# Patient Record
Sex: Male | Born: 1940
Health system: Southern US, Community
[De-identification: ages and names within clinical notes are randomized; demographics above are authoritative.]

## PROBLEM LIST (undated history)

## (undated) DIAGNOSIS — D126 Benign neoplasm of colon, unspecified: Secondary | ICD-10-CM

## (undated) DIAGNOSIS — E119 Type 2 diabetes mellitus without complications: Secondary | ICD-10-CM

## (undated) DIAGNOSIS — K635 Polyp of colon: Secondary | ICD-10-CM

## (undated) DIAGNOSIS — G4730 Sleep apnea, unspecified: Secondary | ICD-10-CM

## (undated) DIAGNOSIS — K219 Gastro-esophageal reflux disease without esophagitis: Secondary | ICD-10-CM

## (undated) DIAGNOSIS — I10 Essential (primary) hypertension: Secondary | ICD-10-CM

## (undated) DIAGNOSIS — E7800 Pure hypercholesterolemia, unspecified: Secondary | ICD-10-CM

## (undated) HISTORY — DX: Polyp of colon: K63.5

## (undated) HISTORY — PX: ROTATOR CUFF REPAIR: SHX139

## (undated) HISTORY — DX: Benign neoplasm of colon, unspecified: D12.6

---

## 1999-01-11 ENCOUNTER — Ambulatory Visit (HOSPITAL_COMMUNITY): Admission: RE | Admit: 1999-01-11 | Discharge: 1999-01-11 | Payer: Self-pay | Admitting: Neurosurgery

## 1999-01-19 ENCOUNTER — Observation Stay (HOSPITAL_COMMUNITY): Admission: RE | Admit: 1999-01-19 | Discharge: 1999-01-20 | Payer: Self-pay | Admitting: Neurosurgery

## 1999-08-30 ENCOUNTER — Encounter: Admission: RE | Admit: 1999-08-30 | Discharge: 1999-08-30 | Payer: Self-pay | Admitting: Neurosurgery

## 2000-08-20 ENCOUNTER — Ambulatory Visit (HOSPITAL_BASED_OUTPATIENT_CLINIC_OR_DEPARTMENT_OTHER): Admission: RE | Admit: 2000-08-20 | Discharge: 2000-08-20 | Payer: Self-pay | Admitting: Otolaryngology

## 2000-09-07 ENCOUNTER — Observation Stay (HOSPITAL_COMMUNITY): Admission: RE | Admit: 2000-09-07 | Discharge: 2000-09-08 | Payer: Self-pay | Admitting: Otolaryngology

## 2000-09-07 ENCOUNTER — Encounter (INDEPENDENT_AMBULATORY_CARE_PROVIDER_SITE_OTHER): Payer: Self-pay | Admitting: Specialist

## 2003-12-14 ENCOUNTER — Encounter: Admission: RE | Admit: 2003-12-14 | Discharge: 2003-12-14 | Payer: Self-pay | Admitting: Internal Medicine

## 2003-12-22 ENCOUNTER — Encounter: Admission: RE | Admit: 2003-12-22 | Discharge: 2003-12-22 | Payer: Self-pay | Admitting: Internal Medicine

## 2004-10-18 ENCOUNTER — Encounter: Admission: RE | Admit: 2004-10-18 | Discharge: 2004-10-18 | Payer: Self-pay | Admitting: Gastroenterology

## 2004-10-25 ENCOUNTER — Encounter: Admission: RE | Admit: 2004-10-25 | Discharge: 2004-10-25 | Payer: Self-pay | Admitting: Gastroenterology

## 2009-01-31 ENCOUNTER — Inpatient Hospital Stay (HOSPITAL_COMMUNITY): Admission: EM | Admit: 2009-01-31 | Discharge: 2009-02-02 | Payer: Self-pay | Admitting: Emergency Medicine

## 2009-02-01 ENCOUNTER — Encounter (INDEPENDENT_AMBULATORY_CARE_PROVIDER_SITE_OTHER): Payer: Self-pay | Admitting: Neurology

## 2009-02-02 ENCOUNTER — Encounter (INDEPENDENT_AMBULATORY_CARE_PROVIDER_SITE_OTHER): Payer: Self-pay | Admitting: Neurology

## 2009-05-21 ENCOUNTER — Ambulatory Visit: Payer: Self-pay | Admitting: Gastroenterology

## 2009-06-02 ENCOUNTER — Telehealth: Payer: Self-pay | Admitting: Gastroenterology

## 2009-06-03 ENCOUNTER — Ambulatory Visit: Payer: Self-pay | Admitting: Gastroenterology

## 2009-06-03 ENCOUNTER — Encounter: Payer: Self-pay | Admitting: Gastroenterology

## 2009-06-08 ENCOUNTER — Encounter: Payer: Self-pay | Admitting: Gastroenterology

## 2009-07-01 ENCOUNTER — Ambulatory Visit (HOSPITAL_BASED_OUTPATIENT_CLINIC_OR_DEPARTMENT_OTHER): Admission: RE | Admit: 2009-07-01 | Discharge: 2009-07-01 | Payer: Self-pay | Admitting: Orthopedic Surgery

## 2011-02-25 LAB — BASIC METABOLIC PANEL
BUN: 18 mg/dL (ref 6–23)
CO2: 27 mEq/L (ref 19–32)
Chloride: 105 mEq/L (ref 96–112)
Creatinine, Ser: 1.05 mg/dL (ref 0.4–1.5)
GFR calc Af Amer: >60 mL/min (ref >60)
Glucose, Bld: 187 mg/dL — ABNORMAL HIGH (ref 70–99)

## 2011-02-25 LAB — GLUCOSE, CAPILLARY: Glucose-Capillary: 103 mg/dL — ABNORMAL HIGH (ref 70–99)

## 2011-02-26 LAB — GLUCOSE, CAPILLARY: Glucose-Capillary: 133 mg/dL — ABNORMAL HIGH (ref 70–99)

## 2011-03-02 LAB — COMPREHENSIVE METABOLIC PANEL
ALT: 30 U/L (ref 0–53)
Albumin: 4 g/dL (ref 3.5–5.2)
Alkaline Phosphatase: 83 U/L (ref 39–117)
Glucose, Bld: 116 mg/dL — ABNORMAL HIGH (ref 70–99)
Potassium: 4.1 mEq/L (ref 3.5–5.1)
Sodium: 136 mEq/L (ref 135–145)
Total Protein: 6.7 g/dL (ref 6.0–8.3)

## 2011-03-02 LAB — LIPID PANEL
LDL Cholesterol: 69 mg/dL (ref 0–99)
Total CHOL/HDL Ratio: 4 RATIO
VLDL: 23 mg/dL (ref 0–40)

## 2011-03-02 LAB — GLUCOSE, CAPILLARY
Glucose-Capillary: 127 mg/dL — ABNORMAL HIGH (ref 70–99)
Glucose-Capillary: 128 mg/dL — ABNORMAL HIGH (ref 70–99)
Glucose-Capillary: 128 mg/dL — ABNORMAL HIGH (ref 70–99)

## 2011-03-02 LAB — CBC
Hemoglobin: 13.1 g/dL (ref 13.0–17.0)
RDW: 14.8 % (ref 11.5–15.5)
WBC: 7.5 10*3/uL (ref 4.0–10.5)

## 2011-03-02 LAB — DIFFERENTIAL
Basophils Relative: 1 % (ref 0–1)
Eosinophils Absolute: 0.1 10*3/uL (ref 0.0–0.7)
Monocytes Absolute: 0.5 10*3/uL (ref 0.1–1.0)
Monocytes Relative: 7 % (ref 3–12)
Neutrophils Relative %: 66 % (ref 43–77)

## 2011-03-02 LAB — HEMOGLOBIN A1C
Hgb A1c MFr Bld: 7.2 % — ABNORMAL HIGH (ref 4.6–6.1)
Mean Plasma Glucose: 160 mg/dL

## 2011-03-02 LAB — URINALYSIS, ROUTINE W REFLEX MICROSCOPIC
Bilirubin Urine: NEGATIVE
Glucose, UA: NEGATIVE mg/dL
Ketones, ur: NEGATIVE mg/dL
pH: 7.5 (ref 5.0–8.0)

## 2011-03-02 LAB — CK TOTAL AND CKMB (NOT AT ARMC)
CK, MB: 1.7 ng/mL (ref 0.3–4.0)
Total CK: 149 U/L (ref 7–232)

## 2011-04-04 NOTE — Op Note (Signed)
NAME:  Timothy [name], Timothy [name] NO.:  [id number]   MEDICAL RECORD NO.:  [id number]          PATIENT TYPE:  AMB   LOCATION:  DSC                          FACILITY:  [facility]   PHYSICIAN:  Loreta Ave, M.D. DATE OF BIRTH:  October 15, 1941   DATE OF PROCEDURE:  07/01/2009  DATE OF DISCHARGE:                               OPERATIVE REPORT   PREOPERATIVE DIAGNOSES:  Chronic impingement, extensive chronic rotator  cuff tear, partial tearing of biceps tendon, distal clavicle osteolysis,  all right shoulder.   POSTOPERATIVE DIAGNOSES:  Chronic impingement, extensive chronic rotator  cuff tear, partial tearing of biceps tendon, distal clavicle osteolysis,  all right shoulder, with essential complete tear, long head biceps  tendon chronic irreparable retracted rotator cuff tear supraspinatus  tendon with destruction of essentially the entire tendon and involvement  of the top of the subscap and infraspinatus.  Impingement with a type 3  acromion.  Grade 4 changes of the acromioclavicular joint.  Complex  tearing of the labrum.  Grade 3 chondral flap tear of glenoid.   PROCEDURE:  Right shoulder exam under anesthesia ,arthroscopy,  debridement of glenohumeral joint, chondral flap, labrum.  Debridement  and resection of intra-articular portion of long head biceps tendon.  Debridement and assessment of rotator cuff.  Bursectomy, acromioplasty,  coracoacromial ligament release.  Excision of distal clavicle.   SURGEON:  Loreta Ave, MD   ASSISTANT:  Genene Churn. Barry Dienes, [place]   ANESTHESIA:  General.   BLOOD LOSS:  Minimal.   SPECIMENS:  None.   CULTURES:  None.   COMPLICATIONS:  None.   DRESSING:  Soft compressive with sling.   PROCEDURE:  The patient brought to the operating room, placed on the  operating table in supine position.  After adequate anesthesia had been  obtained, shoulder examined.  Stiffness but basically full motion and  stable shoulder.  Placed in a beach-chair  position on the shoulder  positioner, prepped and draped in usual sterile fashion.  Three portals  anterior, posterior, and lateral.  Shoulder entered with blunt  obturator.  Arthroscope introduced.  Shoulder distended and inspected.  A deep grade 3 flap tear in the middle of glenoid debrided.  Complex  tearing of labrum debrided.  Most remaining articular cartilage looked  good.  Biceps tendon essentially completely torn throughout the entire  intra-articular portion.  Nothing reparable.  Resected back to the level  of the bicipital groove.  Chronic tearing of supraspinatus tendon which  was assessed above and below.  Essentially destruction of the entire  supraspinatus tendon from the humerus all the way over the  musculotendinous junction.  Scarred, irreparable, fibrotic.  Involvement  of the top of the infraspinatus and subscap.  Debrided back to the  margin of healthy tissue throughout.  From above, I did not feel this  was irreparable and really could not mobilize it out laterally or even  in front to back.  Bursa resected.  Acromioplasty to a type 1 acromion  with shaver and high-speed bur.  CA ligament released.  Distal clavicle  exposed.  Periarticular spurs lateral centimeter of clavicle resected.  At completion, adequacy of decompression and debridement confirmed  viewing from all portals.  I then thoroughly assessed the cuff and even  put FiberWire sutures in this to see if I could mobilize and pull it  with that, and there was really nothing that was reparable at all.  Went  back and just redebrided the margins, so irritated tissue out of the  way.  Instruments and fluid removed.  Portals closed with nylon.  A  sterile compressive dressing applied.  Sling applied.  Anesthesia  reversed.  Brought to recovery room.  Tolerated surgery well.  No  complications.      Loreta Ave, M.D.  Electronically Signed     DFM/MEDQ  D:  07/01/2009  T:  07/02/2009  Job:  [id number]

## 2011-04-04 NOTE — H&P (Signed)
NAME:  Timothy [name], Timothy [name] NO.:  [id number]   MEDICAL RECORD NO.:  [id number]          PATIENT TYPE:  EMS   LOCATION:  MAJO                         FACILITY:  [facility]   PHYSICIAN:  Noel Christmas, MD    DATE OF BIRTH:  1941-04-14   DATE OF ADMISSION:  01/31/2009  DATE OF DISCHARGE:                              HISTORY & PHYSICAL   CHIEF COMPLAINT:  Numbness and clumsiness involving his right hand and  new-onset gait difficulty.   HISTORY OF PRESENT ILLNESS:  This is a 70 year old man who experienced  acute onset of numbness and clumsiness involving his right hand as well  as numbness involving his face about 10'clock last night.  He noticed  clumsiness involving his right leg this morning, but no numbness.  Numbness on the right hand and face has persisted.  He has not  experienced frank weakness.  His wife indicated his speech has been  slightly slurred at times.  CT scan of his head showed no acute  intracranial abnormality.  There is no previous history of stroke or  TIA.  He has not been on antiplatelet therapy.  There has been no  improvement.  No fluctuation of symptoms in its onset.   PAST MEDICAL HISTORY:  Remarkable for hypertension, diabetes mellitus,  degenerative disease, and surgery involving cervical spine, obstructive  sleep apnea with subsequent UPPP procedure, hyperlipidemia, depression,  and GERD.   CURRENT MEDICATIONS:  1. Actos Plus 15/850 b.i.d.  2. Diltiazem.  3. HCl 120/24 daily.  4. Lisinopril 10 mg per day.  5. Prilosec 20 mg per day.  6. Zoloft 100 mg per day.  7. Zocor 40 mg per day.   ALLERGIES:  NKDA.   FAMILY HISTORY:  Positive for stroke involving his father and for  diabetes mellitus involving one sister.   SOCIAL HISTORY:  The patient is retired.  There is no history of tobacco  use.  He infrequently consumes alcohol.  There is no history of illicit  drug use.   REVIEW OF SYSTEMS:  Completely negative except for above  presenting  symptoms as described.   PHYSICAL EXAMINATION:  VITAL SIGNS:  Temperature was 97.8, pulse 88 per  minute, respiration 16 per minute, blood pressure 150/70, and oxygen  saturation was 98% on room air.  GENERAL:  Late middle-aged appearing man of medium built who was  slightly overweight.  He was alert and cooperative, and in no acute  distress.  He was well oriented to time as well as place.  Short-term  and long-term memory were normal.  Affect was appropriate.  HEAD, EYES, EARS, NOSE, AND THROAT:  Normal.  Palatal changes were  consistent with history of UPPP procedure.  NECK:  Supple with no masses or tenderness.  CHEST:  Clear to auscultation.  HEART:  Rate and rhythm were normal.  S1 and S2 were also normal.  He  had no abnormal heart sounds.  ABDOMEN:  Soft and nontender.  Bowel sounds were normal.  SKIN AND MUCOSA:  Normal.  EXTREMITIES:  Normal.  GENITALIA AND RECTUM:  Deferred.  NEUROLOGIC:  Pupils were equal  and reactive normally to light.  Extraocular movements were full and conjugate.  Visual fields were  intact and normal.  There was no facial weakness.  He had mild right  lower facial numbness.  Hearing was normal.  Speech and palatal movement  normal.  Coordination of upper and lower extremity on the right was  slightly impaired.  Coordination of left extremities was normal.  Strength and muscle tone were normal throughout including no pronator  drift of his right upper extremity.  Deep tendon reflexes were normal  and symmetrical.  Plantar responses were flexor.  Sensory exam showed  reduced perception of touch over his right hand compared to left.  There  is no sensory loss involving the right lower extremity.  Carotid  auscultation was normal.   CLINICAL IMPRESSION:  Acute probable subcortical parietal left ischemic  infarction.  This is most likely of small vessel origin given his  history of hypertension and diabetes mellitus and pattern of  presenting  deficits.   PLAN:  1. MRI and MRA of the brain without contrast.  2. Carotid Doppler study.  3. Echocardiogram.  4. Physical therapy and occupational therapy consults.  5. Aspirin 325 mg per day.      Noel Christmas, MD  Electronically Signed     CS/MEDQ  D:  01/31/2009  T:  02/01/2009  Job:  [id number]

## 2011-04-04 NOTE — Discharge Summary (Signed)
NAME:  Timothy [name], Timothy [name] NO.:  [id number]   MEDICAL RECORD NO.:  [id number]          PATIENT TYPE:  INP   LOCATION:  3038                         FACILITY:  [facility]   PHYSICIAN:  Pramod P. Pearlean Brownie, MD    DATE OF BIRTH:  September 06, 1941   DATE OF ADMISSION:  01/31/2009  DATE OF DISCHARGE:  02/02/2009                               DISCHARGE SUMMARY   DIAGNOSES AT TIME OF DISCHARGE:  1. Left thalamic infarct secondary to small-vessel disease.  2. Hypertension.  3. Diabetes.  4. Dyslipidemia.  5. Degenerative disease and surgery of cervical spine.  6. Obstructive sleep apnea with subsequent uvulopalatopharyngoplasty      procedure.  7. Depression.  8. Gastroesophageal reflux disease.   DISCHARGE MEDICATIONS:  1. Simvastatin 40 mg a day.  2. Actos 15 mg/metformin 850 mg b.i.d.  3. Tiazac 120 mg x2 tablets once a day.  4. Omeprazole 20 mg every day.  5. Lisinopril 10 mg every day.  6. Sertraline 100 mg every day.  7. Aspirin 325 mg a day.   STUDIES PERFORMED:  1. CT of the brain on admission showed no acute abnormality.  2. MRI of the brain shows acute left thalamic infarct.  3. MRA of the brain showed no cerebral aneurysm with mild      atherosclerotic irregularity of the posterior cerebral artery on      the left.  4. Carotid Doppler showed no ICA stenosis.  5. A 2-D echocardiogram shows EF of 55-60% with no obvious source of      embolus.  6. EKG shows normal sinus rhythm.   LABORATORY STUDIES:  Homocystine 9.1.  Cholesterol 123, triglycerides  116, HDL 31, and LDL 69.  Hemoglobin A1c 7.2.  Cardiac enzymes negative.  Coags normal.  Urinalysis normal.  Chemistry with glucose 116.  SGOT 59,  otherwise normal.   HISTORY OF PRESENT ILLNESS:  [name] is a 70 year old right-  handed white male who experienced acute onset of numbness and clumsiness  of his right hand and face at 10:00 p.m. the night prior to admission.  He noticed clumsiness involving his  right leg the morning of admission  with no numbness.  Numbness on the right hand and face has persisted.  His wife indicated his speech has been slightly slurred at time.  CT of  the brain showed no acute abnormality.  He has no previous history of  stroke or TIA.  He has not been on aspirin prior to admission.  He was  admitted to the hospital for further stroke evaluation.  He was not a  tPA candidate secondary to time of onset being greater than 3 hours and  quick resolution of symptoms.   HOSPITAL COURSE:  MRI did reveal an acute left thalamic infarct felt to  be secondary to small-vessel disease.  He was placed on aspirin 325 mg a  day for secondary stroke prevention.  He has multiple risk factors for  stroke including hypertension, diabetes, dyslipidemia, and obstructive  sleep apnea.  He will need ongoing risk factor control by his primary  care physician as an  outpatient.  He has no therapy needs and is safe  for discharge home.   CONDITION ON DISCHARGE:  The patient is alert and oriented x3.  Speech  clear.  He complains of right face and hand paresthesias that are very  mild.  He has no aphasia.  He has no drift in his arm or leg.  No  weakness except in his right hand with finger tap.   DISCHARGE PLAN:  1. Discharge home with family.  2. Aspirin for secondary stroke prevention.  3. Ongoing risk factor control by primary care physician.  4. Follow up with primary care physician within 1 month.  5. Follow up with Dr. Delia Heady in 2 months.      Annie Main, N.P.    ______________________________  Sunny Schlein. Pearlean Brownie, MD    SB/MEDQ  D:  02/02/2009  T:  02/03/2009  Job:  [id number]   cc:   Gaspar Garbe, M.D.

## 2011-04-07 NOTE — Op Note (Signed)
[facility]  Patient:    Timothy [name], Timothy [name]                    MRN: [id number] Proc. Date: 09/07/00 Adm. Date:  [id number] Attending:  Waldon Merl CC:         Lum Babe, M.D.   Operative Report  PREOPERATIVE DIAGNOSIS:  Sleep apnea with tonsillar hypertrophy with palatal redundance and a small oral cavity with a septal deviation and turbinate hypertrophy.  POSTOPERATIVE DIAGNOSIS:  Sleep apnea with tonsillar hypertrophy with palatal redundance and a small oral cavity with a septal deviation and turbinate hypertrophy.  OPERATION:  Septal reconstruction, turbinate reduction with palatal pharyngoplasty, and tonsillectomy.  SURGEON:  Keturah Barre, M.D.  ANESTHESIA:  General endotracheal with Mack Guise, M.D.  DESCRIPTION OF PROCEDURE:  The patient was placed in the supine position under general endotracheal anesthesia.  The patient was prepped and draped in a prone manner using Hibiclens x 3 and the usual head drape.  One percent Xylocaine with epinephrine and topical cocaine was used, 200 mg for the local supplemental anesthesia.  A hemitransfixion incision was made on the right side of the columella and carried around to the left, and back along the left quadrilateral cartilage, and the posterior aspect of the quadrilateral cartilage, a strip was taken using the Therapist, nutritional.  The open and closed Jansen-Middletons were then used to take an ethmoid septal deviation portion, and the mucoperiosteum was completely elevated back into the vomerine region, and this was also taken using the open and closed Jansen-Middletons, and the 2 mm chisel.  The remainder of the septum was trimmed and then replaced back on the premaxillary crest so that the septum was in the midline.  The turbinates were outfractured using the butter knife to crush them bilaterally to give more nasal airway space.  Once this was achieved, the closure was begun  using 5-0 plain catgut, and a 3-0 simple suture using 4-0 plain catgut through-and-through x 2, after using a mattress hemostatic stitch.  At this point, attention was then placed in the oral cavity, where the tonsillar gag was then placed.  Once the gag was extended, the tonsils were found to be taking a considerable amount of space.  The oral cavity was small. The uvula was large and redundant as was the palatal mucous membranes.  The tonsils were removed using sharp, blunt, and snare dissection, and hemostasis was established with Bovie electrocoagulation.  Once these were removed, a considerable amount of space was gained.  The uvula was trimmed, taking approximately two-thirds, and bringing it back to a more normal orientation.  The closure of the anterior and posterior mucous membrane was with 5-0 plain catgut.  The palatal mucous membrane was then trimmed, reestablishing the normal contour of the palate which was ranging at about 6 mm, and once this was achieved, all hemostasis was established, and the airway was markedly improved as far as size.  The anesthesia trumpets were then placed, using #7 on each side of the nose.  The patient was awakened.  He tolerated the procedure well and was doing well postoperatively.  His follow up will be in five days, then in two weeks, and four weeks, six weeks, and six months, and one year.  He will stay overnight for observation for his airway considering he has severe sleep apnea. DD:  09/07/00 TD:  09/08/00 Job: [id number]

## 2011-04-07 NOTE — H&P (Signed)
[facility]  Patient:    Timothy [name], Timothy [name]                      MRN: [id number] Attending:  Keturah Barre, M.D. CC:         Lum Babe, M.D.   History and Physical  HISTORY OF PRESENT ILLNESS:  This patient is a 70 year old male who has had severe sleep apnea problems.  He has had a sleep study done showing a sleep respiratory disturbance index of 23.  His lowest O2 saturation was 79.  He only spent 3.4% of his time in deep sleep, REM sleep.  He was awakened 47 times and was awake 9 times greater than 2 minutes.  His height is 5 feet 6 inches and he weighs 205 pounds.  He has a complaint of chronic fatigue where he is falling asleep at key times.  He cannot sit and read very well and with this decreased O2 saturation, he also has had some PVCs on his sleep study.  He was diagnosed with moderate obstructive sleep apnea.  All apneas occurred while patient was supine, but he does not seem to be able to overcome this by positioning and he has had very loud snoring.  We have talked about CPAP and he totally rejected that concept.  He is working on trying to decrease his weight.  He is on ______ 250 mg q.d. and Prilosec 20 mg q.d. for blood pressure elevation.  His blood pressure is mildly elevated at 170/100 and heart rate is 76.  SOCIAL HISTORY:  He quit smoking 30 years ago and does have an occasional alcoholic drink.  PAST HISTORY:  He has no problems with history of anesthesia.  He had a laminectomy several years ago at [facility] and he had a hiatal hernia and is on reflux medication at intermittent times.  He otherwise is in very good condition.  PHYSICAL EXAMINATION  VITAL SIGNS:  His blood pressure is 170/100, pulse is 76, SAO2 is 98%.  He weighs 208 pounds and he is stated to be 5 feet 8 inches in his physical examination.  HEENT:  His ears are clear.  Tympanic membranes look excellent.  Oral cavity is small.  He has tonsils that are  quite large and his palate is low and uvula is large.  His septum is deviated and has poor nasal airway.  CHEST:  Clear.  No rales, rhonchi or wheezes and his chest x-ray was clear.  CARDIAC:  His EKG was normal sinus rhythm.  No acute changes.  No PVCs.  No opening snaps, murmurs or gallops.  ABDOMEN:  Free of any organomegaly, tenderness or mass.  EXTREMITIES:  Initial extremities are unremarkable.  NEUROLOGIC:  Oriented x 3.  Cranial nerves intact.  INITIAL DIAGNOSIS:  Sleep apnea with history of mild obesity, history of hiatal hernia, septal deviation and history of cervical laminectomy at [facility].  ALLERGIES:  He has no allergies to medications. DD:  09/07/00 TD:  09/07/00 Job: [id number]

## 2014-03-11 ENCOUNTER — Encounter: Payer: Self-pay | Admitting: *Deleted

## 2014-03-13 ENCOUNTER — Encounter: Payer: Self-pay | Admitting: Gastroenterology

## 2014-05-21 ENCOUNTER — Encounter: Payer: Self-pay | Admitting: Gastroenterology

## 2015-03-30 ENCOUNTER — Encounter: Payer: Self-pay | Admitting: Gastroenterology

## 2015-10-27 DIAGNOSIS — I10 Essential (primary) hypertension: Secondary | ICD-10-CM | POA: Diagnosis not present

## 2015-10-27 DIAGNOSIS — K760 Fatty (change of) liver, not elsewhere classified: Secondary | ICD-10-CM | POA: Diagnosis not present

## 2015-10-27 DIAGNOSIS — R69 Illness, unspecified: Secondary | ICD-10-CM | POA: Diagnosis not present

## 2015-10-27 DIAGNOSIS — E1151 Type 2 diabetes mellitus with diabetic peripheral angiopathy without gangrene: Secondary | ICD-10-CM | POA: Diagnosis not present

## 2015-10-27 DIAGNOSIS — I493 Ventricular premature depolarization: Secondary | ICD-10-CM | POA: Diagnosis not present

## 2015-10-27 DIAGNOSIS — D692 Other nonthrombocytopenic purpura: Secondary | ICD-10-CM | POA: Diagnosis not present

## 2015-10-27 DIAGNOSIS — H3530 Unspecified macular degeneration: Secondary | ICD-10-CM | POA: Diagnosis not present

## 2015-10-27 DIAGNOSIS — I679 Cerebrovascular disease, unspecified: Secondary | ICD-10-CM | POA: Diagnosis not present

## 2015-10-27 DIAGNOSIS — E7800 Pure hypercholesterolemia, unspecified: Secondary | ICD-10-CM | POA: Diagnosis not present

## 2015-10-27 DIAGNOSIS — Z6835 Body mass index (BMI) 35.0-35.9, adult: Secondary | ICD-10-CM | POA: Diagnosis not present

## 2016-04-28 DIAGNOSIS — Z125 Encounter for screening for malignant neoplasm of prostate: Secondary | ICD-10-CM | POA: Diagnosis not present

## 2016-04-28 DIAGNOSIS — I10 Essential (primary) hypertension: Secondary | ICD-10-CM | POA: Diagnosis not present

## 2016-04-28 DIAGNOSIS — E1151 Type 2 diabetes mellitus with diabetic peripheral angiopathy without gangrene: Secondary | ICD-10-CM | POA: Diagnosis not present

## 2016-05-05 DIAGNOSIS — E7800 Pure hypercholesterolemia, unspecified: Secondary | ICD-10-CM | POA: Diagnosis not present

## 2016-05-05 DIAGNOSIS — I10 Essential (primary) hypertension: Secondary | ICD-10-CM | POA: Diagnosis not present

## 2016-05-05 DIAGNOSIS — M1990 Unspecified osteoarthritis, unspecified site: Secondary | ICD-10-CM | POA: Diagnosis not present

## 2016-05-05 DIAGNOSIS — R69 Illness, unspecified: Secondary | ICD-10-CM | POA: Diagnosis not present

## 2016-05-05 DIAGNOSIS — Z0000 Encounter for general adult medical examination without abnormal findings: Secondary | ICD-10-CM | POA: Diagnosis not present

## 2016-05-05 DIAGNOSIS — K760 Fatty (change of) liver, not elsewhere classified: Secondary | ICD-10-CM | POA: Diagnosis not present

## 2016-05-05 DIAGNOSIS — Z6833 Body mass index (BMI) 33.0-33.9, adult: Secondary | ICD-10-CM | POA: Diagnosis not present

## 2016-05-05 DIAGNOSIS — I679 Cerebrovascular disease, unspecified: Secondary | ICD-10-CM | POA: Diagnosis not present

## 2016-05-05 DIAGNOSIS — H3530 Unspecified macular degeneration: Secondary | ICD-10-CM | POA: Diagnosis not present

## 2016-05-05 DIAGNOSIS — E1151 Type 2 diabetes mellitus with diabetic peripheral angiopathy without gangrene: Secondary | ICD-10-CM | POA: Diagnosis not present

## 2016-08-10 DIAGNOSIS — R69 Illness, unspecified: Secondary | ICD-10-CM | POA: Diagnosis not present

## 2016-10-30 DIAGNOSIS — I679 Cerebrovascular disease, unspecified: Secondary | ICD-10-CM | POA: Diagnosis not present

## 2016-10-30 DIAGNOSIS — D692 Other nonthrombocytopenic purpura: Secondary | ICD-10-CM | POA: Diagnosis not present

## 2016-10-30 DIAGNOSIS — M1990 Unspecified osteoarthritis, unspecified site: Secondary | ICD-10-CM | POA: Diagnosis not present

## 2016-10-30 DIAGNOSIS — H3530 Unspecified macular degeneration: Secondary | ICD-10-CM | POA: Diagnosis not present

## 2016-10-30 DIAGNOSIS — Z6832 Body mass index (BMI) 32.0-32.9, adult: Secondary | ICD-10-CM | POA: Diagnosis not present

## 2016-10-30 DIAGNOSIS — R69 Illness, unspecified: Secondary | ICD-10-CM | POA: Diagnosis not present

## 2016-10-30 DIAGNOSIS — E7800 Pure hypercholesterolemia, unspecified: Secondary | ICD-10-CM | POA: Diagnosis not present

## 2016-10-30 DIAGNOSIS — I10 Essential (primary) hypertension: Secondary | ICD-10-CM | POA: Diagnosis not present

## 2016-10-30 DIAGNOSIS — K760 Fatty (change of) liver, not elsewhere classified: Secondary | ICD-10-CM | POA: Diagnosis not present

## 2016-10-30 DIAGNOSIS — E1151 Type 2 diabetes mellitus with diabetic peripheral angiopathy without gangrene: Secondary | ICD-10-CM | POA: Diagnosis not present

## 2017-05-02 DIAGNOSIS — Z125 Encounter for screening for malignant neoplasm of prostate: Secondary | ICD-10-CM | POA: Diagnosis not present

## 2017-05-02 DIAGNOSIS — E1151 Type 2 diabetes mellitus with diabetic peripheral angiopathy without gangrene: Secondary | ICD-10-CM | POA: Diagnosis not present

## 2017-05-02 DIAGNOSIS — E7800 Pure hypercholesterolemia, unspecified: Secondary | ICD-10-CM | POA: Diagnosis not present

## 2017-05-09 DIAGNOSIS — R69 Illness, unspecified: Secondary | ICD-10-CM | POA: Diagnosis not present

## 2017-05-09 DIAGNOSIS — Z23 Encounter for immunization: Secondary | ICD-10-CM | POA: Diagnosis not present

## 2017-05-09 DIAGNOSIS — Z0000 Encounter for general adult medical examination without abnormal findings: Secondary | ICD-10-CM | POA: Diagnosis not present

## 2017-05-09 DIAGNOSIS — H3530 Unspecified macular degeneration: Secondary | ICD-10-CM | POA: Diagnosis not present

## 2017-05-09 DIAGNOSIS — E1151 Type 2 diabetes mellitus with diabetic peripheral angiopathy without gangrene: Secondary | ICD-10-CM | POA: Diagnosis not present

## 2017-05-09 DIAGNOSIS — M1990 Unspecified osteoarthritis, unspecified site: Secondary | ICD-10-CM | POA: Diagnosis not present

## 2017-05-09 DIAGNOSIS — I10 Essential (primary) hypertension: Secondary | ICD-10-CM | POA: Diagnosis not present

## 2017-05-09 DIAGNOSIS — K760 Fatty (change of) liver, not elsewhere classified: Secondary | ICD-10-CM | POA: Diagnosis not present

## 2017-05-09 DIAGNOSIS — D692 Other nonthrombocytopenic purpura: Secondary | ICD-10-CM | POA: Diagnosis not present

## 2017-05-09 DIAGNOSIS — I6789 Other cerebrovascular disease: Secondary | ICD-10-CM | POA: Diagnosis not present

## 2017-05-09 DIAGNOSIS — E7800 Pure hypercholesterolemia, unspecified: Secondary | ICD-10-CM | POA: Diagnosis not present

## 2017-07-05 ENCOUNTER — Encounter: Payer: Self-pay | Admitting: Internal Medicine

## 2017-07-30 DIAGNOSIS — I10 Essential (primary) hypertension: Secondary | ICD-10-CM | POA: Diagnosis not present

## 2017-07-30 DIAGNOSIS — F419 Anxiety disorder, unspecified: Secondary | ICD-10-CM | POA: Insufficient documentation

## 2017-07-30 DIAGNOSIS — E119 Type 2 diabetes mellitus without complications: Secondary | ICD-10-CM | POA: Insufficient documentation

## 2017-07-30 DIAGNOSIS — R200 Anesthesia of skin: Secondary | ICD-10-CM | POA: Diagnosis not present

## 2017-07-30 DIAGNOSIS — E7800 Pure hypercholesterolemia, unspecified: Secondary | ICD-10-CM | POA: Insufficient documentation

## 2017-08-29 DIAGNOSIS — M1812 Unilateral primary osteoarthritis of first carpometacarpal joint, left hand: Secondary | ICD-10-CM | POA: Diagnosis not present

## 2017-08-29 DIAGNOSIS — R52 Pain, unspecified: Secondary | ICD-10-CM | POA: Insufficient documentation

## 2017-08-29 DIAGNOSIS — M542 Cervicalgia: Secondary | ICD-10-CM | POA: Insufficient documentation

## 2017-08-29 DIAGNOSIS — M79641 Pain in right hand: Secondary | ICD-10-CM | POA: Diagnosis not present

## 2017-08-29 DIAGNOSIS — R200 Anesthesia of skin: Secondary | ICD-10-CM | POA: Insufficient documentation

## 2017-08-29 DIAGNOSIS — M79642 Pain in left hand: Secondary | ICD-10-CM | POA: Diagnosis not present

## 2017-09-04 DIAGNOSIS — M79642 Pain in left hand: Secondary | ICD-10-CM | POA: Diagnosis not present

## 2017-09-04 DIAGNOSIS — M5412 Radiculopathy, cervical region: Secondary | ICD-10-CM | POA: Diagnosis not present

## 2017-09-04 DIAGNOSIS — Z981 Arthrodesis status: Secondary | ICD-10-CM | POA: Diagnosis not present

## 2017-09-04 DIAGNOSIS — G8929 Other chronic pain: Secondary | ICD-10-CM | POA: Diagnosis not present

## 2017-09-04 DIAGNOSIS — G5601 Carpal tunnel syndrome, right upper limb: Secondary | ICD-10-CM | POA: Diagnosis not present

## 2017-09-05 DIAGNOSIS — M1812 Unilateral primary osteoarthritis of first carpometacarpal joint, left hand: Secondary | ICD-10-CM | POA: Diagnosis not present

## 2017-09-05 DIAGNOSIS — M542 Cervicalgia: Secondary | ICD-10-CM | POA: Diagnosis not present

## 2017-09-05 DIAGNOSIS — R200 Anesthesia of skin: Secondary | ICD-10-CM | POA: Diagnosis not present

## 2017-09-06 ENCOUNTER — Other Ambulatory Visit: Payer: Self-pay | Admitting: Orthopedic Surgery

## 2017-09-06 ENCOUNTER — Encounter (HOSPITAL_BASED_OUTPATIENT_CLINIC_OR_DEPARTMENT_OTHER): Payer: Self-pay | Admitting: *Deleted

## 2017-09-07 ENCOUNTER — Encounter (HOSPITAL_BASED_OUTPATIENT_CLINIC_OR_DEPARTMENT_OTHER): Payer: Self-pay | Admitting: *Deleted

## 2017-09-10 ENCOUNTER — Encounter (HOSPITAL_BASED_OUTPATIENT_CLINIC_OR_DEPARTMENT_OTHER)
Admission: RE | Admit: 2017-09-10 | Discharge: 2017-09-10 | Disposition: A | Discharge status: Home / Self Care | Payer: Medicare HMO | Source: Ambulatory Visit | Attending: Orthopedic Surgery | Admitting: Orthopedic Surgery

## 2017-09-10 DIAGNOSIS — G5601 Carpal tunnel syndrome, right upper limb: Secondary | ICD-10-CM | POA: Diagnosis not present

## 2017-09-10 DIAGNOSIS — E7800 Pure hypercholesterolemia, unspecified: Secondary | ICD-10-CM | POA: Diagnosis not present

## 2017-09-10 DIAGNOSIS — I10 Essential (primary) hypertension: Secondary | ICD-10-CM | POA: Diagnosis not present

## 2017-09-10 DIAGNOSIS — K219 Gastro-esophageal reflux disease without esophagitis: Secondary | ICD-10-CM | POA: Diagnosis not present

## 2017-09-10 DIAGNOSIS — Z86010 Personal history of colonic polyps: Secondary | ICD-10-CM | POA: Diagnosis not present

## 2017-09-10 DIAGNOSIS — G4730 Sleep apnea, unspecified: Secondary | ICD-10-CM | POA: Diagnosis not present

## 2017-09-10 DIAGNOSIS — Z7984 Long term (current) use of oral hypoglycemic drugs: Secondary | ICD-10-CM | POA: Diagnosis not present

## 2017-09-10 DIAGNOSIS — E119 Type 2 diabetes mellitus without complications: Secondary | ICD-10-CM | POA: Diagnosis not present

## 2017-09-10 DIAGNOSIS — Z79899 Other long term (current) drug therapy: Secondary | ICD-10-CM | POA: Diagnosis not present

## 2017-09-10 LAB — BASIC METABOLIC PANEL
Anion gap: 8 (ref 5–15)
BUN: 22 mg/dL — ABNORMAL HIGH (ref 6–20)
CHLORIDE: 104 mmol/L (ref 101–111)
CO2: 25 mmol/L (ref 22–32)
Calcium: 8.6 mg/dL — ABNORMAL LOW (ref 8.9–10.3)
Creatinine, Ser: 1.12 mg/dL (ref 0.61–1.24)
GFR calc non Af Amer: >60 mL/min (ref >60)
Glucose, Bld: 127 mg/dL — ABNORMAL HIGH (ref 65–99)
POTASSIUM: 3.8 mmol/L (ref 3.5–5.1)
SODIUM: 137 mmol/L (ref 135–145)

## 2017-09-11 ENCOUNTER — Ambulatory Visit (HOSPITAL_BASED_OUTPATIENT_CLINIC_OR_DEPARTMENT_OTHER): Payer: Medicare HMO | Admitting: Anesthesiology

## 2017-09-11 ENCOUNTER — Encounter (HOSPITAL_BASED_OUTPATIENT_CLINIC_OR_DEPARTMENT_OTHER)
Admission: RE | Disposition: A | Discharge status: Home / Self Care | Payer: Self-pay | Source: Ambulatory Visit | Attending: Orthopedic Surgery

## 2017-09-11 ENCOUNTER — Ambulatory Visit (HOSPITAL_BASED_OUTPATIENT_CLINIC_OR_DEPARTMENT_OTHER)
Admission: RE | Admit: 2017-09-11 | Discharge: 2017-09-11 | Disposition: A | Discharge status: Home / Self Care | Payer: Medicare HMO | Source: Ambulatory Visit | Attending: Orthopedic Surgery | Admitting: Orthopedic Surgery

## 2017-09-11 ENCOUNTER — Encounter (HOSPITAL_BASED_OUTPATIENT_CLINIC_OR_DEPARTMENT_OTHER): Payer: Self-pay | Admitting: *Deleted

## 2017-09-11 DIAGNOSIS — G4730 Sleep apnea, unspecified: Secondary | ICD-10-CM | POA: Diagnosis not present

## 2017-09-11 DIAGNOSIS — E119 Type 2 diabetes mellitus without complications: Secondary | ICD-10-CM | POA: Insufficient documentation

## 2017-09-11 DIAGNOSIS — Z7984 Long term (current) use of oral hypoglycemic drugs: Secondary | ICD-10-CM | POA: Diagnosis not present

## 2017-09-11 DIAGNOSIS — I10 Essential (primary) hypertension: Secondary | ICD-10-CM | POA: Diagnosis not present

## 2017-09-11 DIAGNOSIS — G5601 Carpal tunnel syndrome, right upper limb: Secondary | ICD-10-CM | POA: Insufficient documentation

## 2017-09-11 DIAGNOSIS — Z79899 Other long term (current) drug therapy: Secondary | ICD-10-CM | POA: Insufficient documentation

## 2017-09-11 DIAGNOSIS — K219 Gastro-esophageal reflux disease without esophagitis: Secondary | ICD-10-CM | POA: Insufficient documentation

## 2017-09-11 DIAGNOSIS — E7800 Pure hypercholesterolemia, unspecified: Secondary | ICD-10-CM | POA: Diagnosis not present

## 2017-09-11 DIAGNOSIS — Z86010 Personal history of colonic polyps: Secondary | ICD-10-CM | POA: Diagnosis not present

## 2017-09-11 HISTORY — DX: Sleep apnea, unspecified: G47.30

## 2017-09-11 HISTORY — DX: Pure hypercholesterolemia, unspecified: E78.00

## 2017-09-11 HISTORY — DX: Essential (primary) hypertension: I10

## 2017-09-11 HISTORY — DX: Gastro-esophageal reflux disease without esophagitis: K21.9

## 2017-09-11 HISTORY — PX: CARPAL TUNNEL RELEASE: SHX101

## 2017-09-11 HISTORY — DX: Type 2 diabetes mellitus without complications: E11.9

## 2017-09-11 LAB — GLUCOSE, CAPILLARY
GLUCOSE-CAPILLARY: 113 mg/dL — AB (ref 65–99)
GLUCOSE-CAPILLARY: 121 mg/dL — AB (ref 65–99)

## 2017-09-11 SURGERY — CARPAL TUNNEL RELEASE
Anesthesia: Regional | Site: Wrist | Laterality: Right

## 2017-09-11 MED ORDER — ONDANSETRON HCL 4 MG/2ML IJ SOLN
INTRAMUSCULAR | Status: DC | PRN
  Administered 2017-09-11: 4 mg via INTRAVENOUS

## 2017-09-11 MED ORDER — PHENYLEPHRINE 40 MCG/ML (10ML) SYRINGE FOR IV PUSH (FOR BLOOD PRESSURE SUPPORT)
PREFILLED_SYRINGE | INTRAVENOUS | Status: AC
  Filled 2017-09-11: qty 10

## 2017-09-11 MED ORDER — FENTANYL CITRATE (PF) 100 MCG/2ML IJ SOLN: 25.0000 ug | INTRAMUSCULAR | Status: DC | PRN

## 2017-09-11 MED ORDER — SUCCINYLCHOLINE CHLORIDE 200 MG/10ML IV SOSY
PREFILLED_SYRINGE | INTRAVENOUS | Status: AC
  Filled 2017-09-11: qty 10

## 2017-09-11 MED ORDER — ONDANSETRON HCL 4 MG/2ML IJ SOLN
INTRAMUSCULAR | Status: AC
  Filled 2017-09-11: qty 2

## 2017-09-11 MED ORDER — BUPIVACAINE HCL (PF) 0.25 % IJ SOLN
INTRAMUSCULAR | Status: DC | PRN
  Administered 2017-09-11: 6 mL

## 2017-09-11 MED ORDER — LACTATED RINGERS IV SOLN
INTRAVENOUS | Status: DC
  Administered 2017-09-11: 08:00:00 via INTRAVENOUS

## 2017-09-11 MED ORDER — LIDOCAINE 2% (20 MG/ML) 5 ML SYRINGE
INTRAMUSCULAR | Status: AC
  Filled 2017-09-11: qty 5

## 2017-09-11 MED ORDER — FENTANYL CITRATE (PF) 100 MCG/2ML IJ SOLN
INTRAMUSCULAR | Status: AC
  Filled 2017-09-11: qty 2

## 2017-09-11 MED ORDER — CHLORHEXIDINE GLUCONATE 4 % EX LIQD: 60.0000 mL | Freq: Once | CUTANEOUS | Status: DC

## 2017-09-11 MED ORDER — LIDOCAINE HCL (PF) 0.5 % IJ SOLN
INTRAMUSCULAR | Status: DC | PRN
  Administered 2017-09-11: 30 mL via INTRAVENOUS

## 2017-09-11 MED ORDER — FENTANYL CITRATE (PF) 100 MCG/2ML IJ SOLN
50.0000 ug | INTRAMUSCULAR | Status: DC | PRN
  Administered 2017-09-11: 50 ug via INTRAVENOUS

## 2017-09-11 MED ORDER — MIDAZOLAM HCL 2 MG/2ML IJ SOLN
INTRAMUSCULAR | Status: AC
  Filled 2017-09-11: qty 2

## 2017-09-11 MED ORDER — MIDAZOLAM HCL 2 MG/2ML IJ SOLN
1.0000 mg | INTRAMUSCULAR | Status: DC | PRN
  Administered 2017-09-11: 1 mg via INTRAVENOUS

## 2017-09-11 MED ORDER — BUPIVACAINE HCL (PF) 0.25 % IJ SOLN
INTRAMUSCULAR | Status: AC
  Filled 2017-09-11: qty 90

## 2017-09-11 MED ORDER — CEFAZOLIN SODIUM-DEXTROSE 2-4 GM/100ML-% IV SOLN
2.0000 g | INTRAVENOUS | Status: AC
  Administered 2017-09-11: 2 g via INTRAVENOUS

## 2017-09-11 MED ORDER — CEFAZOLIN SODIUM-DEXTROSE 2-4 GM/100ML-% IV SOLN
INTRAVENOUS | Status: AC
  Filled 2017-09-11: qty 100

## 2017-09-11 MED ORDER — BUPIVACAINE HCL (PF) 0.5 % IJ SOLN
INTRAMUSCULAR | Status: AC
  Filled 2017-09-11: qty 30

## 2017-09-11 MED ORDER — BUPIVACAINE-EPINEPHRINE (PF) 0.5% -1:200000 IJ SOLN
INTRAMUSCULAR | Status: AC
  Filled 2017-09-11: qty 30

## 2017-09-11 MED ORDER — MEPERIDINE HCL 25 MG/ML IJ SOLN: 6.2500 mg | INTRAMUSCULAR | Status: DC | PRN

## 2017-09-11 MED ORDER — EPHEDRINE 5 MG/ML INJ
INTRAVENOUS | Status: AC
  Filled 2017-09-11: qty 10

## 2017-09-11 MED ORDER — HYDROCODONE-ACETAMINOPHEN 5-325 MG PO TABS: 1.0000 | ORAL_TABLET | Freq: Four times a day (QID) | ORAL | 0 refills | Status: DC | PRN

## 2017-09-11 MED ORDER — METOCLOPRAMIDE HCL 5 MG/ML IJ SOLN: 10.0000 mg | Freq: Once | INTRAMUSCULAR | Status: DC | PRN

## 2017-09-11 MED ORDER — PROPOFOL 10 MG/ML IV BOLUS
INTRAVENOUS | Status: DC | PRN
  Administered 2017-09-11: 20 mg via INTRAVENOUS

## 2017-09-11 MED ORDER — SCOPOLAMINE 1 MG/3DAYS TD PT72: 1.0000 | MEDICATED_PATCH | Freq: Once | TRANSDERMAL | Status: DC | PRN

## 2017-09-11 SURGICAL SUPPLY — 34 items
BLADE SURG 15 STRL LF DISP TIS (BLADE) ×1 IMPLANT
BLADE SURG 15 STRL SS (BLADE) ×1
BNDG COHESIVE 3X5 TAN STRL LF (GAUZE/BANDAGES/DRESSINGS) ×2 IMPLANT
BNDG ESMARK 4X9 LF (GAUZE/BANDAGES/DRESSINGS) ×2 IMPLANT
BNDG GAUZE ELAST 4 BULKY (GAUZE/BANDAGES/DRESSINGS) ×2 IMPLANT
CHLORAPREP W/TINT 26ML (MISCELLANEOUS) ×2 IMPLANT
CORD BIPOLAR FORCEPS 12FT (ELECTRODE) ×2 IMPLANT
COVER BACK TABLE 60X90IN (DRAPES) ×2 IMPLANT
COVER MAYO STAND STRL (DRAPES) ×2 IMPLANT
CUFF TOURNIQUET SINGLE 18IN (TOURNIQUET CUFF) ×2 IMPLANT
DRAPE EXTREMITY T 121X128X90 (DRAPE) ×2 IMPLANT
DRAPE SURG 17X23 STRL (DRAPES) ×2 IMPLANT
DRSG PAD ABDOMINAL 8X10 ST (GAUZE/BANDAGES/DRESSINGS) ×2 IMPLANT
GAUZE SPONGE 4X4 12PLY STRL (GAUZE/BANDAGES/DRESSINGS) ×2 IMPLANT
GAUZE XEROFORM 1X8 LF (GAUZE/BANDAGES/DRESSINGS) ×2 IMPLANT
GLOVE BIOGEL PI IND STRL 7.0 (GLOVE) ×2 IMPLANT
GLOVE BIOGEL PI IND STRL 8.5 (GLOVE) ×1 IMPLANT
GLOVE BIOGEL PI INDICATOR 7.0 (GLOVE) ×2
GLOVE BIOGEL PI INDICATOR 8.5 (GLOVE) ×1
GLOVE ECLIPSE 6.5 STRL STRAW (GLOVE) ×2 IMPLANT
GLOVE SURG ORTHO 8.0 STRL STRW (GLOVE) ×4 IMPLANT
GOWN STRL REUS W/ TWL LRG LVL3 (GOWN DISPOSABLE) ×1 IMPLANT
GOWN STRL REUS W/TWL LRG LVL3 (GOWN DISPOSABLE) ×1
GOWN STRL REUS W/TWL XL LVL3 (GOWN DISPOSABLE) ×2 IMPLANT
NEEDLE PRECISIONGLIDE 27X1.5 (NEEDLE) ×2 IMPLANT
NS IRRIG 1000ML POUR BTL (IV SOLUTION) ×2 IMPLANT
PACK BASIN DAY SURGERY FS (CUSTOM PROCEDURE TRAY) ×2 IMPLANT
STOCKINETTE 4X48 STRL (DRAPES) ×2 IMPLANT
SUT ETHILON 4 0 PS 2 18 (SUTURE) ×2 IMPLANT
SUT VICRYL 4-0 PS2 18IN ABS (SUTURE) IMPLANT
SYR BULB 3OZ (MISCELLANEOUS) ×2 IMPLANT
SYR CONTROL 10ML LL (SYRINGE) ×2 IMPLANT
TOWEL OR 17X24 6PK STRL BLUE (TOWEL DISPOSABLE) ×2 IMPLANT
UNDERPAD 30X30 (UNDERPADS AND DIAPERS) IMPLANT

## 2017-09-11 NOTE — Anesthesia Preprocedure Evaluation (Signed)
Anesthesia Evaluation  Patient identified by MRN, date of birth, ID band Patient awake    Reviewed: Allergy & Precautions, NPO status , Patient's Chart, lab work & pertinent test results  Airway Mallampati: II  TM Distance: >3 FB Neck ROM: Full    Dental no notable dental hx.    Pulmonary sleep apnea ,    Pulmonary exam normal breath sounds clear to auscultation       Cardiovascular hypertension, Pt. on medications Normal cardiovascular exam Rhythm:Regular Rate:Normal     Neuro/Psych negative neurological ROS  negative psych ROS   GI/Hepatic Neg liver ROS, GERD  Controlled,  Endo/Other  diabetes, Type 2  Renal/GU negative Renal ROS  negative genitourinary   Musculoskeletal negative musculoskeletal ROS (+)   Abdominal   Peds negative pediatric ROS (+)  Hematology negative hematology ROS (+)   Anesthesia Other Findings   Reproductive/Obstetrics negative OB ROS                             Anesthesia Physical Anesthesia Plan  ASA: II  Anesthesia Plan: Bier Block   Post-op Pain Management:    Induction:   PONV Risk Score and Plan: 2 and Ondansetron and Treatment may vary due to age or medical condition  Airway Management Planned: Simple Face Mask  Additional Equipment:   Intra-op Plan:   Post-operative Plan:   Informed Consent: I have reviewed the patients History and Physical, chart, labs and discussed the procedure including the risks, benefits and alternatives for the proposed anesthesia with the patient or authorized representative who has indicated his/her understanding and acceptance.   Dental advisory given  Plan Discussed with: CRNA  Anesthesia Plan Comments:         Anesthesia Quick Evaluation

## 2017-09-11 NOTE — Brief Op Note (Signed)
09/11/2017  9:13 AM  PATIENT:  Timothy [name]  76 y.o. male  PRE-OPERATIVE DIAGNOSIS:  right carpal tunnel syndrome  POST-OPERATIVE DIAGNOSIS:  right carpal tunnel syndrome  PROCEDURE:  Procedure(s): RIGHT CARPAL TUNNEL RELEASE (Right)  SURGEON:  Surgeon(s) and Role:    Daryll Brod, MD - Primary  PHYSICIAN ASSISTANT:   ASSISTANTS: none   ANESTHESIA:   local, regional and IV sedation  EBL:  1 mL   BLOOD ADMINISTERED:none  DRAINS: none   LOCAL MEDICATIONS USED:  BUPIVICAINE   SPECIMEN:  No Specimen  DISPOSITION OF SPECIMEN:  N/A  COUNTS:  YES  TOURNIQUET:  * Missing tourniquet times found for documented tourniquets in log:  [id number] *  DICTATION: .Other Dictation: Dictation Number [id number]  PLAN OF CARE: Discharge to home after PACU  PATIENT DISPOSITION:  PACU - hemodynamically stable.

## 2017-09-11 NOTE — Anesthesia Postprocedure Evaluation (Signed)
Anesthesia Post Note  Patient: Timothy [name]  Procedure(s) Performed: RIGHT CARPAL TUNNEL RELEASE (Right Wrist)     Patient location during evaluation: PACU Anesthesia Type: Bier Block Level of consciousness: awake and alert Pain management: pain level controlled Vital Signs Assessment: post-procedure vital signs reviewed and stable Respiratory status: spontaneous breathing, nonlabored ventilation, respiratory function stable and patient connected to nasal cannula oxygen Cardiovascular status: stable and blood pressure returned to baseline Postop Assessment: no apparent nausea or vomiting Anesthetic complications: no    Last Vitals:  Vitals:   09/11/17 0930 09/11/17 1015  BP: (!) 193/84 (!) 190/81  Pulse: (!) 59 (!) 56  Resp: 10 18  Temp:  36.7 C  SpO2: 100% 99%    Last Pain:  Vitals:   09/11/17 1015  TempSrc:   PainSc: 0-No pain                 Montez Hageman

## 2017-09-11 NOTE — Transfer of Care (Signed)
Immediate Anesthesia Transfer of Care Note  Patient: Timothy [name]  Procedure(s) Performed: RIGHT CARPAL TUNNEL RELEASE (Right Wrist)  Patient Location: PACU  Anesthesia Type:MAC and Bier block  Level of Consciousness: awake, alert  and oriented  Airway & Oxygen Therapy: Patient Spontanous Breathing and Patient connected to face mask oxygen  Post-op Assessment: Report given to RN and Post -op Vital signs reviewed and stable  Post vital signs: Reviewed and stable  Last Vitals:  Vitals:   09/11/17 0750 09/11/17 0753  BP: (!) 199/77 (!) 190/84  Pulse: 66   Resp: 18   Temp: 36.6 C   SpO2: 100%     Last Pain:  Vitals:   09/11/17 0750  TempSrc: Oral  PainSc: 5       Patients Stated Pain Goal: 2 (71/69/67 8938)  Complications: No apparent anesthesia complications

## 2017-09-11 NOTE — Discharge Instructions (Addendum)

## 2017-09-11 NOTE — H&P (Signed)
Timothy [name] is an 76 y.o. male.   Chief Complaint:numbness right hand HPI: Timothy [name] is a 76 year old right-hand-dominant male who comes in with a complaint of pain stiffness of both hands right greater than left numbness and tingling of all of his digits. He states his right thumb is numb on a constant basis. He is waking 7 out of 7 nights. He states his been going a month to 6 weeks. Notes no history of injury to the hand or to the neck but has undergone a fusion of his cervical spine by Dr. Theda Sers many years ago. He states nothing seems to make it better or worse. Is not tried taking anything for this. Right is much worse than left. He has a history of diabetes no history of thyroid problems arthritis or gout. Family history is negative for each of these also.He was sent to Dr. Thereasa Parkin for nerve conductions which have been done. These reveal carpal tunnel syndrome right hand.          Past Medical History:  Diagnosis Date  . Diabetes mellitus without complication ([place])   . Elevated cholesterol   . GERD (gastroesophageal reflux disease)   . Hyperplastic colonic polyp    2010  . Hypertension   . Sleep apnea    no cpap  . Tubular adenoma of colon    2010    Past Surgical History:  Procedure Laterality Date  . ROTATOR CUFF REPAIR      History reviewed. No pertinent family history. Social History:  reports that he has never smoked. He has never used smokeless tobacco. He reports that he does not drink alcohol or use drugs.  Allergies: No Known Allergies  Medications Prior to Admission  Medication Sig Dispense Refill  . diltiazem (TIAZAC) 240 MG 24 hr capsule Take 240 mg by mouth daily.    . hydrochlorothiazide (HYDRODIURIL) 25 MG tablet Take 25 mg by mouth daily.    Marland Kitchen lisinopril (PRINIVIL,ZESTRIL) 40 MG tablet Take 40 mg by mouth daily.    . metFORMIN (GLUCOPHAGE) 850 MG tablet Take 850 mg by mouth 2 (two) times daily with a meal.    . omeprazole (PRILOSEC) 20 MG capsule  Take 20 mg by mouth daily.    . pravastatin (PRAVACHOL) 20 MG tablet Take 20 mg by mouth daily.    . sertraline (ZOLOFT) 100 MG tablet Take 100 mg by mouth daily.      Results for orders placed or performed during the hospital encounter of 09/11/17 (from the past 48 hour(s))  Basic metabolic panel     Status: Abnormal   Collection Time: 09/10/17  2:59 PM  Result Value Ref Range   Sodium 137 135 - 145 mmol/L   Potassium 3.8 3.5 - 5.1 mmol/L   Chloride 104 101 - 111 mmol/L   CO2 25 22 - 32 mmol/L   Glucose, Bld 127 (H) 65 - 99 mg/dL   BUN 22 (H) 6 - 20 mg/dL   Creatinine, Ser 1.12 0.61 - 1.24 mg/dL   Calcium 8.6 (L) 8.9 - 10.3 mg/dL   GFR calc non Af Amer >60 >60 mL/min   GFR calc Af Amer >60 >60 mL/min    Comment: (NOTE) The eGFR has been calculated using the CKD EPI equation. This calculation has not been validated in all clinical situations. eGFR's persistently <60 mL/min signify possible Chronic Kidney Disease.    Anion gap 8 5 - 15  Glucose, capillary     Status: Abnormal   Collection  Time: 09/11/17  7:57 AM  Result Value Ref Range   Glucose-Capillary 113 (H) 65 - 99 mg/dL    No results found.   Pertinent items are noted in HPI.  Blood pressure (!) 190/84, pulse 66, temperature 97.9 F (36.6 C), temperature source Oral, resp. rate 18, height _0  (1.676 m), weight 94.3 kg (208 lb), SpO2 100 %.  General appearance: alert, cooperative and appears stated age Head: Normocephalic, without obvious abnormality Neck: no JVD Resp: clear to auscultation bilaterally Cardio: regular rate and rhythm, S1, S2 normal, no murmur, click, rub or gallop GI: soft, non-tender; bowel sounds normal; no masses,  no organomegaly Extremities: numbness right hand Pulses: 2+ and symmetric Skin: Skin color, texture, turgor normal. No rashes or lesions Neurologic: Grossly normal Incision/Wound: na  Assessment/Plan Diagnosis carpal tunnel syndrome right hand. Plan: We have discussed  possibility surgical decompression of the median nerve right side. Pre-peri-and postoperative course are discussed along with risks and complications. He is where there is no guarantee to the surgery the possibility of infection recurrence injury to arteries nerves tendons incomplete relief symptoms dystrophy. Questions are encouraged and answered to his satisfaction. He would like to proceed with right carpal tunnel release this will be performed as an outpatient under regional anesthesia.         Tolulope Pinkett R 09/11/2017, 8:28 AM

## 2017-09-11 NOTE — Op Note (Signed)
Dictation Number [id number]

## 2017-09-11 NOTE — Op Note (Signed)
NAME:  Timothy [name], Timothy [name] NO.:  [id number]  MEDICAL RECORD NO.:  [id number]  LOCATION:                                 FACILITY:  PHYSICIAN:  Daryll Brod, M.D.            DATE OF BIRTH:  DATE OF PROCEDURE:  09/11/2017 DATE OF DISCHARGE:                              OPERATIVE REPORT   PREOPERATIVE DIAGNOSIS:  Carpal tunnel syndrome, right hand.  POSTOPERATIVE DIAGNOSIS:  Carpal tunnel syndrome, right hand.  OPERATION:  Decompression, right median nerve.  SURGEON:  Daryll Brod, MD.  ANESTHESIA:  Forearm-based IV regional with local infiltration.  PLACE OF SURGERY:  [facility].  ANESTHESIOLOGIST:  [place].  HISTORY:  The patient is a 76 year old male with a history of carpal tunnel syndrome, EMG nerve conductions positive.  He has elected to undergo surgical decompression to the median nerve on the right side. Pre, peri, and postoperative courses have been discussed along with risks and complications.  He is aware that there is no guarantee to the surgery, the possibility of infection, recurrence of injury to arteries, nerves, and tendons, incomplete relief of symptoms, and dystrophy.  In the preoperative area, the patient is seen, the extremity marked by both patient and surgeon, antibiotic given.  DESCRIPTION OF PROCEDURE:  The patient was brought to the operating room where a forearm-based IV regional anesthetic was carried out without difficulty under the direction of the Anesthesia Department.  He was prepped using ChloraPrep in a supine position with the right arm free. A 3-minute dry time was allowed and time-out was taken confirming the patient and procedure.  A longitudinal incision was then made in the right palm, carried down through subcutaneous tissue.  Bleeders were electrocauterized with bipolar.  Palmar fascia was split.  The superficial palmar arch was identified.  The flexor tendon to the ring and little finger was identified.  To  the ulnar side of the median nerve, the carpal retinaculum was then incised with sharp dissection.  A right-angle and Sewell retractor were placed between skin and forearm fascia.  The fascia was then released after dissecting the dorsal tissues away from the upper forearm fascia.  This was done for approximately 2 cm proximal to the wrist crease under direct vision. The canal was explored.  An area of compression to the nerve was apparent.  He had pedunculated area on the nerve.  This was left intact. The motor branch entered into muscle distally.  No further lesions were identified.  The wound was copiously irrigated with saline.  The skin was then closed with interrupted 4-0 nylon sutures.  Local infiltration with 0.25% bupivacaine without epinephrine was given, approximately 8 mL was used.  A sterile compressive dressing with fingers free was applied. On deflation of the tourniquet, all fingers immediately pinked.  He was taken to the recovery room for observation in satisfactory condition. He will be discharged home to return the [place] in 1 week, on Norco.          ______________________________ Daryll Brod, M.D.     GK/MEDQ  D:  09/11/2017  T:  09/11/2017  Job:  [id number]

## 2017-09-12 ENCOUNTER — Encounter (HOSPITAL_BASED_OUTPATIENT_CLINIC_OR_DEPARTMENT_OTHER): Payer: Self-pay | Admitting: Orthopedic Surgery

## 2017-10-17 ENCOUNTER — Other Ambulatory Visit: Payer: Self-pay | Admitting: Podiatry

## 2017-10-17 ENCOUNTER — Encounter: Payer: Self-pay | Admitting: Podiatry

## 2017-10-17 ENCOUNTER — Ambulatory Visit (INDEPENDENT_AMBULATORY_CARE_PROVIDER_SITE_OTHER): Payer: Medicare HMO

## 2017-10-17 ENCOUNTER — Ambulatory Visit: Payer: Medicare HMO | Admitting: Podiatry

## 2017-10-17 VITALS — BP 171/86 | HR 72 | Ht 66.0 in | Wt 200.0 lb

## 2017-10-17 DIAGNOSIS — M79671 Pain in right foot: Secondary | ICD-10-CM

## 2017-10-17 DIAGNOSIS — M779 Enthesopathy, unspecified: Principal | ICD-10-CM

## 2017-10-17 DIAGNOSIS — M7751 Other enthesopathy of right foot: Secondary | ICD-10-CM | POA: Diagnosis not present

## 2017-10-17 DIAGNOSIS — M778 Other enthesopathies, not elsewhere classified: Secondary | ICD-10-CM

## 2017-10-17 DIAGNOSIS — M19071 Primary osteoarthritis, right ankle and foot: Secondary | ICD-10-CM | POA: Diagnosis not present

## 2017-10-17 NOTE — Progress Notes (Signed)
   Subjective:    Patient ID: Timothy [name], male    DOB: 28-Mar-1941, 76 y.o.   MRN: [id number]  HPI  Chief Complaint  Patient presents with  . Diabetes    well controlled  . Foot Pain    right foot pain, top of foot - sometimes sharp, shooting up through shin       Review of Systems  All other systems reviewed and are negative.      Objective:   Physical Exam        Assessment & Plan:

## 2017-10-21 NOTE — Progress Notes (Signed)
   HPI: 76 year old male presenting today as a new patient with a complaint of intermittent right foot pain that has been ongoing for a few years. He reports the pain start on the dorsum of the foot and radiates up the anterior RLE. He reports associated intermittent swelling. Turning the foot in certain directions causes sharp pain. He denies alleviating factors. He has not done anything to treat the symptoms.    Past Medical History:  Diagnosis Date  . Diabetes mellitus without complication ([place])   . Elevated cholesterol   . GERD (gastroesophageal reflux disease)   . Hyperplastic colonic polyp    2010  . Hypertension   . Sleep apnea    no cpap  . Tubular adenoma of colon    2010     Physical Exam: General: The patient is alert and oriented x3 in no acute distress.  Dermatology: Skin is warm, dry and supple bilateral lower extremities. Negative for open lesions or macerations.  Vascular: Palpable pedal pulses bilaterally. No edema or erythema noted. Capillary refill within normal limits.  Neurological: Epicritic and protective threshold grossly intact bilaterally.   Musculoskeletal Exam: Pain with palpation to the right midfoot. Range of motion within normal limits to all pedal and ankle joints bilateral. Muscle strength 5/5 in all groups bilateral.   Radiographic Exam:  Diffuse degenerative changes noted throughout the midtarsal joints of the right foot.    Assessment: - DJD/Capsulitis right midfoot   Plan of Care:  - Patient evaluated. X-Rays reviewed.  - Injection of 0.5 mLs Celestone Soluspan injected into the right midfoot. - Recommended OTC Aleve as needed. - Recommended good shoe gear. - Return to clinic when necessary.    Edrick Kins, DPM [facility]  Dr. Edrick Kins, DPM    [address], [place] [address]                Office [phone]  Fax [phone]

## 2017-11-07 DIAGNOSIS — K760 Fatty (change of) liver, not elsewhere classified: Secondary | ICD-10-CM | POA: Diagnosis not present

## 2017-11-07 DIAGNOSIS — Z6833 Body mass index (BMI) 33.0-33.9, adult: Secondary | ICD-10-CM | POA: Diagnosis not present

## 2017-11-07 DIAGNOSIS — R69 Illness, unspecified: Secondary | ICD-10-CM | POA: Diagnosis not present

## 2017-11-07 DIAGNOSIS — E7800 Pure hypercholesterolemia, unspecified: Secondary | ICD-10-CM | POA: Diagnosis not present

## 2017-11-07 DIAGNOSIS — I10 Essential (primary) hypertension: Secondary | ICD-10-CM | POA: Diagnosis not present

## 2017-11-07 DIAGNOSIS — I6789 Other cerebrovascular disease: Secondary | ICD-10-CM | POA: Diagnosis not present

## 2017-11-07 DIAGNOSIS — E1151 Type 2 diabetes mellitus with diabetic peripheral angiopathy without gangrene: Secondary | ICD-10-CM | POA: Diagnosis not present

## 2017-11-07 DIAGNOSIS — D692 Other nonthrombocytopenic purpura: Secondary | ICD-10-CM | POA: Diagnosis not present

## 2017-11-07 DIAGNOSIS — Z23 Encounter for immunization: Secondary | ICD-10-CM | POA: Diagnosis not present

## 2017-11-07 DIAGNOSIS — M1990 Unspecified osteoarthritis, unspecified site: Secondary | ICD-10-CM | POA: Diagnosis not present

## 2018-01-15 ENCOUNTER — Other Ambulatory Visit: Payer: Self-pay

## 2018-01-15 ENCOUNTER — Emergency Department (HOSPITAL_COMMUNITY): Payer: Medicare HMO

## 2018-01-15 ENCOUNTER — Encounter: Payer: Self-pay | Admitting: Physician Assistant

## 2018-01-15 ENCOUNTER — Encounter (HOSPITAL_COMMUNITY): Payer: Self-pay

## 2018-01-15 ENCOUNTER — Emergency Department (HOSPITAL_COMMUNITY)
Admission: EM | Admit: 2018-01-15 | Discharge: 2018-01-15 | Disposition: A | Discharge status: Home / Self Care | Payer: Medicare HMO | Attending: Emergency Medicine | Admitting: Emergency Medicine

## 2018-01-15 DIAGNOSIS — R079 Chest pain, unspecified: Secondary | ICD-10-CM | POA: Diagnosis not present

## 2018-01-15 DIAGNOSIS — R1013 Epigastric pain: Secondary | ICD-10-CM | POA: Diagnosis not present

## 2018-01-15 DIAGNOSIS — K219 Gastro-esophageal reflux disease without esophagitis: Secondary | ICD-10-CM

## 2018-01-15 DIAGNOSIS — R1310 Dysphagia, unspecified: Secondary | ICD-10-CM | POA: Diagnosis not present

## 2018-01-15 DIAGNOSIS — I10 Essential (primary) hypertension: Secondary | ICD-10-CM | POA: Insufficient documentation

## 2018-01-15 DIAGNOSIS — Z79899 Other long term (current) drug therapy: Secondary | ICD-10-CM | POA: Insufficient documentation

## 2018-01-15 DIAGNOSIS — E119 Type 2 diabetes mellitus without complications: Secondary | ICD-10-CM | POA: Diagnosis not present

## 2018-01-15 DIAGNOSIS — R1319 Other dysphagia: Secondary | ICD-10-CM

## 2018-01-15 DIAGNOSIS — Z7984 Long term (current) use of oral hypoglycemic drugs: Secondary | ICD-10-CM | POA: Insufficient documentation

## 2018-01-15 DIAGNOSIS — R1314 Dysphagia, pharyngoesophageal phase: Secondary | ICD-10-CM | POA: Diagnosis not present

## 2018-01-15 LAB — CBC
HEMATOCRIT: 41.3 % (ref 39.0–52.0)
Hemoglobin: 13.3 g/dL (ref 13.0–17.0)
MCH: 27 pg (ref 26.0–34.0)
MCHC: 32.2 g/dL (ref 30.0–36.0)
MCV: 83.9 fL (ref 78.0–100.0)
PLATELETS: 181 10*3/uL (ref 150–400)
RBC: 4.92 MIL/uL (ref 4.22–5.81)
RDW: 15.7 % — AB (ref 11.5–15.5)
WBC: 9.5 10*3/uL (ref 4.0–10.5)

## 2018-01-15 LAB — BASIC METABOLIC PANEL
Anion gap: 12 (ref 5–15)
BUN: 18 mg/dL (ref 6–20)
CO2: 24 mmol/L (ref 22–32)
Calcium: 9.2 mg/dL (ref 8.9–10.3)
Chloride: 104 mmol/L (ref 101–111)
Creatinine, Ser: 0.93 mg/dL (ref 0.61–1.24)
GFR calc Af Amer: >60 mL/min (ref >60)
Glucose, Bld: 108 mg/dL — ABNORMAL HIGH (ref 65–99)
POTASSIUM: 4.1 mmol/L (ref 3.5–5.1)
SODIUM: 140 mmol/L (ref 135–145)

## 2018-01-15 LAB — I-STAT TROPONIN, ED: TROPONIN I, POC: 0.01 ng/mL (ref 0.00–0.08)

## 2018-01-15 MED ORDER — PANTOPRAZOLE SODIUM 40 MG IV SOLR
40.0000 mg | Freq: Once | INTRAVENOUS | Status: AC
  Administered 2018-01-15: 40 mg via INTRAVENOUS
  Filled 2018-01-15: qty 40

## 2018-01-15 MED ORDER — SUCRALFATE 1 GM/10ML PO SUSP: 1.0000 g | Freq: Four times a day (QID) | ORAL | 1 refills | Status: DC

## 2018-01-15 MED ORDER — SUCRALFATE 1 G PO TABS
1.0000 g | ORAL_TABLET | Freq: Once | ORAL | Status: AC
  Administered 2018-01-15: 1 g via ORAL
  Filled 2018-01-15: qty 1

## 2018-01-15 NOTE — ED Provider Notes (Addendum)
[facility] EMERGENCY DEPARTMENT Provider Note   CSN: [id number] Arrival date & time: 01/15/18  1138     History   Chief Complaint Chief Complaint  Patient presents with  . Chest Pain  . Abdominal Pain    HPI Timothy [name] is a 77 y.o. male with PMH of type 2 DM, GERD, HLD, and HTN presenting for epigastric pain and dysphagia.   Patient states that 2-3 months ago he began experiencing the sensation of food getting in his chest, with associated burning epigastric pain that is non-radiating. He states it would happen infrequently and the sensation would subside after several minutes. It was intermittent and some days he would be asymptomatic. His dysphagia began to increase in frequency. He states it has happened several times this week and he has been unable to eat or take his medications consistently. He has also recently been experiencing burping and regurgitation of food after he eats. He has been unable to eat solid foods the past day; no particular food is associated with the pain. Yesterday, he attempted to eat and once again had the sensation of food stuck in his chest, as well as burning epigastric pain. The dysphagia and pain did not subside and seemed more severe. He called his PCP who recommended he come to the ED to be evaluated.    Patient states he has been on prilosec for GERD for approximately 20 years and had no symptoms of reflux until 3 months ago when his symptoms started. Patient denies history of cancer, radiation, surgery, or unintended weight loss.  Denies NSAID use. No melena or blood in stool. The patient drinks approximately 6-8 beers a week . Is a former smoker, quit 40 years ago.       Past Medical History:  Diagnosis Date  . Diabetes mellitus without complication ([place])   . Elevated cholesterol   . GERD (gastroesophageal reflux disease)   . Hyperplastic colonic polyp    2010  . Hypertension   . Sleep apnea    no cpap  . Tubular  adenoma of colon    2010    Patient Active Problem List   Diagnosis Date Noted  . Neck pain 08/29/2017  . Numbness 08/29/2017  . Pain 08/29/2017  . Primary osteoarthritis of first carpometacarpal joint of left hand 08/29/2017  . Anxiety 07/30/2017  . Diabetes mellitus ([place]) 07/30/2017  . Hypercholesterolemia 07/30/2017  . Hypertensive disorder 07/30/2017    Past Surgical History:  Procedure Laterality Date  . CARPAL TUNNEL RELEASE Right 09/11/2017   Procedure: RIGHT CARPAL TUNNEL RELEASE;  Surgeon: Daryll Brod, MD;  Location: [facility];  Service: Orthopedics;  Laterality: Right;  . ROTATOR CUFF REPAIR         Home Medications    Prior to Admission medications   Medication Sig Start Date End Date Taking? Authorizing Provider  diltiazem (TIAZAC) 240 MG 24 hr capsule Take 240 mg by mouth daily.    [provider]  hydrochlorothiazide (HYDRODIURIL) 25 MG tablet Take 25 mg by mouth daily.    [provider]  HYDROcodone-acetaminophen (NORCO) 5-325 MG tablet Take 1 tablet by mouth every 6 (six) hours as needed. 09/11/17   Daryll Brod, MD  lisinopril (PRINIVIL,ZESTRIL) 40 MG tablet Take 40 mg by mouth daily.    [provider]  metFORMIN (GLUCOPHAGE) 850 MG tablet Take 850 mg by mouth 2 (two) times daily with a meal.    [provider]  omeprazole (PRILOSEC) 20  MG capsule Take 20 mg by mouth daily.    [provider]  pravastatin (PRAVACHOL) 20 MG tablet Take 20 mg by mouth daily.    [provider]  sertraline (ZOLOFT) 100 MG tablet Take 100 mg by mouth daily.    [provider]    Family History No family history on file.  Social History Social History   Tobacco Use  . Smoking status: Never Smoker  . Smokeless tobacco: Never Used  Substance Use Topics  . Alcohol use: No    Comment: occasional  . Drug use: No     Allergies   Patient has no known allergies.   Review of Systems Review  of Systems  Constitutional: Negative for chills and fever.  HENT: Positive for trouble swallowing.   Eyes: Negative.   Respiratory: Negative for cough.   Cardiovascular: Positive for chest pain (intermittent, with exeriton).  Gastrointestinal: Positive for abdominal pain. Negative for blood in stool, diarrhea, nausea and vomiting.  Genitourinary: Negative.      Physical Exam Updated Vital Signs BP (!) 167/85   Pulse 69   Temp 98.8 F (37.1 C) (Oral)   Resp 15   SpO2 98%   Physical Exam  Constitutional: He is oriented to person, place, and time. He appears well-developed and well-nourished. No distress.  HENT:  Head: Normocephalic and atraumatic.  Mouth/Throat: Oropharynx is clear and moist.  Eyes: Conjunctivae are normal. No scleral icterus.  Neck: Normal range of motion. Neck supple.  Cardiovascular: Normal rate, regular rhythm and normal heart sounds.  Pulmonary/Chest: Effort normal and breath sounds normal. He has no wheezes. He has no rales.  Abdominal: Soft. Bowel sounds are normal. He exhibits no distension. There is no tenderness.  Musculoskeletal: Normal range of motion. He exhibits no edema.  Neurological: He is alert and oriented to person, place, and time. He has normal strength. No cranial nerve deficit or sensory deficit.  Skin: Skin is warm and dry.     ED Treatments / Results  Labs (all labs ordered are listed, but only abnormal results are displayed) Labs Reviewed  BASIC METABOLIC PANEL - Abnormal; Notable for the following components:      Result Value   Glucose, Bld 108 (*)    All other components within normal limits  CBC - Abnormal; Notable for the following components:   RDW 15.7 (*)    All other components within normal limits  I-STAT TROPONIN, ED    EKG  EKG Interpretation  Date/Time:  Tuesday January 15 2018 11:44:06 EST Ventricular Rate:  78 PR Interval:  156 QRS Duration: 80 QT Interval:  402 QTC Calculation: 458 R  Axis:   -14 Text Interpretation:  Normal sinus rhythm T wave abnormality Artifact Abnormal ekg Confirmed by Carmin Muskrat [phone]) on 01/15/2018 12:56:24 PM       Radiology Dg Chest 2 View  Result Date: 01/15/2018 CLINICAL DATA:  Intermittent chest pain for the past 2 months. EXAM: CHEST  2 VIEW COMPARISON:  Chest x-ray report dated January 17, 1999. FINDINGS: The heart size and mediastinal contours are within normal limits. Normal pulmonary vascularity. Atherosclerotic calcification of the aortic arch. There is an 11 mm density in the mid to lower left lung, not clearly seen on the lateral view. Low lung volumes. Elevation of the left hemidiaphragm with left basilar atelectasis. No focal consolidation, pleural effusion, or pneumothorax. No acute osseous abnormality. Lower cervical ACDF. IMPRESSION: 1. 11 mm nodular density in the mid to lower left lung,  not clearly seen on lateral view. This may represent a nipple shadow. Recommend repeat PA view with nipple markers. 2. Low lung volumes.  No active cardiopulmonary disease. Electronically Signed   By: Titus Dubin M.D.   On: 01/15/2018 13:23    Procedures Procedures (including critical care time)  Medications Ordered in ED Medications  pantoprazole (PROTONIX) injection 40 mg (40 mg Intravenous Given 01/15/18 1448)  sucralfate (CARAFATE) tablet 1 g (1 g Oral Given 01/15/18 1548)     Initial Impression / Assessment and Plan / ED Course  I have reviewed the triage vital signs and the nursing notes.  Pertinent labs & imaging results that were available during my care of the patient were reviewed by me and considered in my medical decision making (see chart for details).   77 yo male PMH of type 2 DM, GERD, HLD, and HTN presenting for epigastric pain and dysphagia for 2-3 months with progressively worsening symptoms, acutely worsened within the past day. Dysphagia associated with regurgitation of food. No melena, blood in stool, or  unintentional weight loss. Able to tolerate liquids always, and solids intermittently. Patient hypertensive, otherwise vital signs are stable. I-stat troponin negative, EKG sinus rhythm, without ST segment changes. CBC and BMET without significant abnormality. Patient's pain is epigastric and is likely GI in origin. His symptoms seem consistent with esophageal dysphagia. No red flag symptoms. Will treat with IV protonix and Carafate.   Patient was seen by [facility] in 2010. Patient is stable to be discharged and will need outpatient GI evaluation and close follow up. Made the patient a follow up appointment scheduled with  on 01/23/2018 at 8:45 AM. Strict return precautions discussed with the patient.   Final Clinical Impressions(s) / ED Diagnoses   Final diagnoses:  Esophageal dysphagia  Gastroesophageal reflux disease, esophagitis presence not specified    ED Discharge Orders    None       Melanee Spry, MD 01/15/18 1603    Melanee Spry, MD 01/15/18 1610    Carmin Muskrat, MD 01/19/18 6578    Carmin Muskrat, MD 01/25/18 2142

## 2018-01-15 NOTE — Discharge Instructions (Addendum)
Timothy [name],   Your labs and physical exam were reassuring. I believe your symptoms are originating from your esophagus not your heart. You will need to closely follow up with a gastroenterologist to evaluate the cause of your symptoms. You have an appointment scheduled with [facility] on Wednesday January 23, 2018 at 8:45. Please attend this appointment or call to reschedule if you are unable to make it.  If your symptoms acutely worsen or change please be re-evaluated in the emergency department. If can no longer tolerate fluids be re-evaluated.

## 2018-01-15 NOTE — ED Triage Notes (Signed)
Pt reports having difficulty eating over the last several couple months d/t feeling as though there is food stuck at epigastric area. Over the last 3 days this has gotten worse and pain has been more constant. Pt reports he has been unable to eat any solids x 3 days.

## 2018-01-15 NOTE — ED Notes (Signed)
Pt stable, ambulatory, states understanding of discharge instructions 

## 2018-01-15 NOTE — ED Notes (Signed)
Pt keeping fluids down currently

## 2018-01-23 ENCOUNTER — Ambulatory Visit: Payer: Medicare HMO | Admitting: Physician Assistant

## 2018-01-23 ENCOUNTER — Encounter: Payer: Self-pay | Admitting: Physician Assistant

## 2018-01-23 VITALS — BP 136/70 | HR 60 | Ht 66.0 in | Wt 206.8 lb

## 2018-01-23 DIAGNOSIS — Z86010 Personal history of colonic polyps: Secondary | ICD-10-CM | POA: Diagnosis not present

## 2018-01-23 DIAGNOSIS — K219 Gastro-esophageal reflux disease without esophagitis: Secondary | ICD-10-CM | POA: Diagnosis not present

## 2018-01-23 DIAGNOSIS — R1310 Dysphagia, unspecified: Secondary | ICD-10-CM | POA: Diagnosis not present

## 2018-01-23 MED ORDER — NA SULFATE-K SULFATE-MG SULF 17.5-3.13-1.6 GM/177ML PO SOLN: ORAL | 0 refills | Status: DC

## 2018-01-23 NOTE — Patient Instructions (Signed)
If you are age 78 or older, your body mass index should be between 23-30. Your Body mass index is 33.38 kg/m. If this is out of the aforementioned range listed, please consider follow up with your Primary Care Provider.  If you are age 57 or younger, your body mass index should be between 19-25. Your Body mass index is 33.38 kg/m. If this is out of the aformentioned range listed, please consider follow up with your Primary Care Provider.   You have been scheduled for an endoscopy and colonoscopy. Please follow the written instructions given to you at your visit today. Please pick up your prep supplies at the pharmacy within the next 1-3 days. If you use inhalers (even only as needed), please bring them with you on the day of your procedure. Your physician has requested that you go to www.startemmi.com and enter the access code given to you at your visit today. This web site gives a general overview about your procedure. However, you should still follow specific instructions given to you by our office regarding your preparation for the procedure.  We have sent the following medications to your pharmacy for you to pick up at your convenience: Suprep  Continue Omeprazole.  Thank you for choosing me and [facility].  Ellouise Newer, PA-c

## 2018-01-23 NOTE — Progress Notes (Signed)
Reviewed and agree with initial management plan.  Brexley Cutshaw T. Kairi Harshbarger, MD FACG 

## 2018-01-23 NOTE — Progress Notes (Signed)
Chief Complaint: Dysphagia  HPI:    Timothy [name] is a 77 year old Caucasian male, known to Dr. Deatra Ina, with a past medical history as listed below, who was referred to me by Tisovec, Fransico Him, MD for a complaint of dysphasia.      Last colonoscopy 06/03/09 with 2 polyps and mild diverticulosis.  Repeat was recommended in 5 years. Patient seen in ER 01/15/18 with a sensation of food getting caught in his chest and associated burning epigastric pain.  BMP, CBC and i-STAT troponin normal.    Today, explains that he has had reflux since the age of 60 with flares here and there.  Eventually after about 30 years or so he was started on Prilosec 20 mg daily and had no problems until about 4 months ago when he started with an increase in reflux symptoms and trouble with solid food getting stuck in his throat. This has increased in frequency over the past 2-3 weeks, now occurring 3-4 days out of the week.  Typically will eventually go down within 30-45 minutes but does cause some esophageal discomfort.    Denies fever, chills, abdominal pain, weight loss, change in bowel habits or blood in his stool.  Past Medical History:  Diagnosis Date  . Diabetes mellitus without complication ([place])   . Elevated cholesterol   . GERD (gastroesophageal reflux disease)   . Hyperplastic colonic polyp    2010  . Hypertension   . Sleep apnea    no cpap  . Tubular adenoma of colon    2010    Past Surgical History:  Procedure Laterality Date  . CARPAL TUNNEL RELEASE Right 09/11/2017   Procedure: RIGHT CARPAL TUNNEL RELEASE;  Surgeon: Daryll Brod, MD;  Location: [facility];  Service: Orthopedics;  Laterality: Right;  . ROTATOR CUFF REPAIR      Current Outpatient Medications  Medication Sig Dispense Refill  . diltiazem (TIAZAC) 240 MG 24 hr capsule Take 240 mg by mouth daily.    . hydrochlorothiazide (HYDRODIURIL) 25 MG tablet Take 25 mg by mouth daily.    Marland Kitchen HYDROcodone-acetaminophen (NORCO)  5-325 MG tablet Take 1 tablet by mouth every 6 (six) hours as needed. 20 tablet 0  . lisinopril (PRINIVIL,ZESTRIL) 40 MG tablet Take 40 mg by mouth daily.    . metFORMIN (GLUCOPHAGE) 850 MG tablet Take 850 mg by mouth 2 (two) times daily with a meal.    . naproxen sodium (ALEVE) 220 MG tablet Take 220 mg by mouth 2 (two) times daily as needed (pain).    Marland Kitchen omeprazole (PRILOSEC) 20 MG capsule Take 20 mg by mouth daily.    . pravastatin (PRAVACHOL) 20 MG tablet Take 20 mg by mouth daily.    . sertraline (ZOLOFT) 100 MG tablet Take 100 mg by mouth daily.    . sucralfate (CARAFATE) 1 GM/10ML suspension Take 10 mLs (1 g total) by mouth 4 (four) times daily. 420 mL 1   No current facility-administered medications for this visit.     Allergies as of 01/23/2018  . (No Known Allergies)    No family history on file.  Social History   Socioeconomic History  . Marital status: Married    Spouse name: Not on file  . Number of children: 2  . Years of education: Not on file  . Highest education level: Not on file  Social Needs  . Financial resource strain: Not on file  . Food insecurity - worry: Not on file  . Food insecurity -  inability: Not on file  . Transportation needs - medical: Not on file  . Transportation needs - non-medical: Not on file  Occupational History  . Occupation: Retired  Tobacco Use  . Smoking status: Never Smoker  . Smokeless tobacco: Never Used  Substance and Sexual Activity  . Alcohol use: No    Comment: occasional  . Drug use: No  . Sexual activity: Not on file  Other Topics Concern  . Not on file  Social History Narrative  . Not on file    Review of Systems:    Constitutional: No weight loss, fever or chills Skin: No rash Cardiovascular: No chest pain  Respiratory: No SOB  Gastrointestinal: See HPI and otherwise negative Genitourinary: No dysuria Neurological: No headache, dizziness or syncope Musculoskeletal: No new muscle or joint pain Hematologic:  No bleeding  Psychiatric: No history of depression or anxiety   Physical Exam:  Vital signs: BP 136/70   Pulse 60   Ht 5\' 6"  (1.676 m)   Wt 206 lb 12.8 oz (93.8 kg)   BMI 33.38 kg/m   Constitutional:   Pleasant Caucasian male appears to be in NAD, Well developed, Well nourished, alert and cooperative Head:  Normocephalic and atraumatic. Eyes:   PEERL, EOMI. No icterus. Conjunctiva pink. Ears:  Normal auditory acuity. Neck:  Supple Throat: Oral cavity and pharynx without inflammation, swelling or lesion.  Respiratory: Respirations even and unlabored. Lungs clear to auscultation bilaterally.   No wheezes, crackles, or rhonchi.  Cardiovascular: Normal S1, S2. No MRG. Regular rate and rhythm. No peripheral edema, cyanosis or pallor.  Gastrointestinal:  Soft, nondistended, mild epigastric ttp No rebound or guarding. Normal bowel sounds. No appreciable masses or hepatomegaly. Rectal:  Not performed.  Msk:  Symmetrical without gross deformities. Without edema, no deformity or joint abnormality.  Neurologic:  Alert and  oriented x4;  grossly normal neurologically.  Skin:   Dry and intact without significant lesions or rashes. Psychiatric: Demonstrates good judgement and reason without abnormal affect or behaviors.  RELEVANT LABS AND IMAGING: CBC    Component Value Date/Time   WBC 9.5 01/15/2018 1156   RBC 4.92 01/15/2018 1156   HGB 13.3 01/15/2018 1156   HCT 41.3 01/15/2018 1156   PLT 181 01/15/2018 1156   MCV 83.9 01/15/2018 1156   MCH 27.0 01/15/2018 1156   MCHC 32.2 01/15/2018 1156   RDW 15.7 (H) 01/15/2018 1156   LYMPHSABS 1.8 01/31/2009 1050   MONOABS 0.5 01/31/2009 1050   EOSABS 0.1 01/31/2009 1050   BASOSABS 0.1 01/31/2009 1050    CMP     Component Value Date/Time   NA 140 01/15/2018 1156   K 4.1 01/15/2018 1156   CL 104 01/15/2018 1156   CO2 24 01/15/2018 1156   GLUCOSE 108 (H) 01/15/2018 1156   BUN 18 01/15/2018 1156   CREATININE 0.93 01/15/2018 1156   CALCIUM  9.2 01/15/2018 1156   PROT 6.7 01/31/2009 1050   ALBUMIN 4.0 01/31/2009 1050   AST 59 (H) 01/31/2009 1050   ALT 30 01/31/2009 1050   ALKPHOS 83 01/31/2009 1050   BILITOT 0.8 01/31/2009 1050   GFRNONAA >60 01/15/2018 1156   GFRAA >60 01/15/2018 1156    Assessment: 1.  Dysphagia: Over the past 3-4 months; most likely esophageal stricture 2.  History of adenomatous polyps: Last colonoscopy 2010, 2 adenomatous polyps, repeat recommended in 5 years, 4 years overdue 3.  GERD: For 61 years, controlled for the majority of that time, though not over the past  3-4 months; concern regarding Barrett's esophagus or other  Plan: 1.  Scheduled patient for an EGD with dilation and surveillance colonoscopy in [place] with Dr. Fuller Plan.  Did discuss risk, benefits, limitations and alternatives and patient agrees to proceed. 2.  Reviewed anti-dysphagia measures including taking small bites, chewing well, avoiding distraction while eating and the chin tuck technique. 3.  Patient to continue his Omeprazole. 4.  Patient to follow in clinic per recommendations from Dr. Fuller Plan after time of procedures  Ellouise Newer, PA-C [facility] 01/23/2018, 9:04 AM  Cc: Haywood Pao, MD

## 2018-02-13 ENCOUNTER — Encounter: Payer: Self-pay | Admitting: Gastroenterology

## 2018-02-26 ENCOUNTER — Ambulatory Visit (AMBULATORY_SURGERY_CENTER): Payer: Medicare HMO | Admitting: Gastroenterology

## 2018-02-26 ENCOUNTER — Other Ambulatory Visit: Payer: Self-pay

## 2018-02-26 ENCOUNTER — Encounter: Payer: Self-pay | Admitting: Gastroenterology

## 2018-02-26 VITALS — BP 121/62 | HR 78 | Temp 98.6°F | Resp 22 | Ht 65.0 in | Wt 200.0 lb

## 2018-02-26 DIAGNOSIS — D123 Benign neoplasm of transverse colon: Secondary | ICD-10-CM

## 2018-02-26 DIAGNOSIS — K319 Disease of stomach and duodenum, unspecified: Secondary | ICD-10-CM | POA: Diagnosis not present

## 2018-02-26 DIAGNOSIS — D124 Benign neoplasm of descending colon: Secondary | ICD-10-CM

## 2018-02-26 DIAGNOSIS — D128 Benign neoplasm of rectum: Secondary | ICD-10-CM

## 2018-02-26 DIAGNOSIS — K621 Rectal polyp: Secondary | ICD-10-CM

## 2018-02-26 DIAGNOSIS — R1310 Dysphagia, unspecified: Secondary | ICD-10-CM

## 2018-02-26 DIAGNOSIS — E119 Type 2 diabetes mellitus without complications: Secondary | ICD-10-CM | POA: Diagnosis not present

## 2018-02-26 DIAGNOSIS — Z86010 Personal history of colonic polyps: Secondary | ICD-10-CM

## 2018-02-26 DIAGNOSIS — I10 Essential (primary) hypertension: Secondary | ICD-10-CM | POA: Diagnosis not present

## 2018-02-26 DIAGNOSIS — B9681 Helicobacter pylori [H. pylori] as the cause of diseases classified elsewhere: Secondary | ICD-10-CM | POA: Diagnosis not present

## 2018-02-26 MED ORDER — OMEPRAZOLE 20 MG PO CPDR: 20.0000 mg | DELAYED_RELEASE_CAPSULE | Freq: Two times a day (BID) | ORAL | 3 refills | Status: AC

## 2018-02-26 MED ORDER — SODIUM CHLORIDE 0.9 % IV SOLN: 500.0000 mL | Freq: Once | INTRAVENOUS | Status: DC

## 2018-02-26 NOTE — Op Note (Addendum)
[facility] Patient Name: Timothy [name] Procedure Date: 02/26/2018 2:21 PM MRN: [id number] Endoscopist: Ladene Artist , MD Age: 77 Referring MD:  Date of Birth: 03-Jan-1941 Gender: Male Account #: [id number] Procedure:                Colonoscopy Indications:              Surveillance: Personal history of adenomatous                            polyps on last colonoscopy 5 years ago Medicines:                Monitored Anesthesia Care Procedure:                Pre-Anesthesia Assessment:                           - Prior to the procedure, a History and Physical                            was performed, and patient medications and                            allergies were reviewed. The patient's tolerance of                            previous anesthesia was also reviewed. The risks                            and benefits of the procedure and the sedation                            options and risks were discussed with the patient.                            All questions were answered, and informed consent                            was obtained. Prior Anticoagulants: The patient has                            taken no previous anticoagulant or antiplatelet                            agents. ASA Grade Assessment: II - A patient with                            mild systemic disease. After reviewing the risks                            and benefits, the patient was deemed in                            satisfactory condition to undergo the procedure.  After obtaining informed consent, the colonoscope                            was passed under direct vision. Throughout the                            procedure, the patient's blood pressure, pulse, and                            oxygen saturations were monitored continuously. The                            Model PCF-[id number]) scope was introduced                            through the anus and  advanced to the the cecum,                            identified by appendiceal orifice and ileocecal                            valve. The ileocecal valve, appendiceal orifice,                            and rectum were photographed. The quality of the                            bowel preparation was good after extensive lavage                            and suctioning. The colonoscopy was performed                            without difficulty. The patient tolerated the                            procedure well. Scope In: 2:30:16 PM Scope Out: 2:49:21 PM Scope Withdrawal Time: 0 hours 16 minutes 30 seconds  Total Procedure Duration: 0 hours 19 minutes 5 seconds  Findings:                 The perianal and digital rectal examinations were                            normal.                           Six sessile polyps were found in the rectum (1),                            descending colon (2), transverse colon (2) and                            hepatic flexure (1). The polyps were 6 to 8 mm in  size. These polyps were removed with a cold snare.                            Resection and retrieval were complete.                           A few small-mouthed diverticula were found in the                            left colon.                           Internal hemorrhoids were found during                            retroflexion. The hemorrhoids were small and Grade                            I (internal hemorrhoids that do not prolapse).                           The exam was otherwise without abnormality on                            direct and retroflexion views. Complications:            No immediate complications. Estimated blood loss:                            None. Estimated Blood Loss:     Estimated blood loss: none. Impression:               - Six 6 to 8 mm polyps in the rectum, in the                            descending colon, in the transverse colon  and at                            the hepatic flexure, removed with a cold snare.                            Resected and retrieved.                           - Diverticulosis in the left colon.                           - Internal hemorrhoids.                           - The examination was otherwise normal on direct                            and retroflexion views. Recommendation:           - Repeat colonoscopy in 3 years for surveillance,  pending pathology review.                           - Patient has a contact number available for                            emergencies. The signs and symptoms of potential                            delayed complications were discussed with the                            patient. Return to normal activities tomorrow.                            Written discharge instructions were provided to the                            patient.                           - Resume previous diet.                           - Continue present medications.                           - Await pathology results. Ladene Artist, MD 02/26/2018 2:52:15 PM This report has been signed electronically.

## 2018-02-26 NOTE — Progress Notes (Signed)
Report given to PACU, vss 

## 2018-02-26 NOTE — Progress Notes (Signed)
No problems noted in the recovery room. maw 

## 2018-02-26 NOTE — Op Note (Signed)
[name] Procedure Date: 02/26/2018 2:21 PM MRN: [id number] Endoscopist: Ladene Artist , MD Age: 77 Referring MD:  Date of Birth: [birth year] Gender: Male Account #: [id number] Procedure:                Upper GI endoscopy Indications:              Dysphagia Medicines:                Monitored Anesthesia Care Procedure:                Pre-Anesthesia Assessment:                           - Prior to the procedure, a History and Physical                            was performed, and patient medications and                            allergies were reviewed. The patient's tolerance of                            previous anesthesia was also reviewed. The risks                            and benefits of the procedure and the sedation                            options and risks were discussed with the patient.                            All questions were answered, and informed consent                            was obtained. Prior Anticoagulants: The patient has                            taken no previous anticoagulant or antiplatelet                            agents. ASA Grade Assessment: II - A patient with                            mild systemic disease. After reviewing the risks                            and benefits, the patient was deemed in                            satisfactory condition to undergo the procedure.                           After obtaining informed consent, the endoscope was  passed under direct vision. Throughout the                            procedure, the patient's blood pressure, pulse, and                            oxygen saturations were monitored continuously. The                            Endoscope was introduced through the mouth, and                            advanced to the second part of duodenum. The upper                            GI endoscopy was accomplished without  difficulty.                            The patient tolerated the procedure well. Scope In: Scope Out: Findings:                 No endoscopic abnormality was evident in the                            esophagus to explain the patient's complaint of                            dysphagia. It was decided, however, to proceed with                            dilation of the entire esophagus. A guidewire was                            placed and the scope was withdrawn. Dilation was                            performed with a Savary dilator with no resistance                            at 16 mm.                           Patchy mildly erythematous mucosa without bleeding                            was found in the gastric fundus and in the gastric                            body. Biopsies were taken with a cold forceps for                            histology.  The exam of the stomach was otherwise normal.                           The duodenal bulb and second portion of the                            duodenum were normal. Complications:            No immediate complications. Estimated Blood Loss:     Estimated blood loss was minimal. Impression:               - No endoscopic esophageal abnormality to explain                            patient's dysphagia. Esophagus dilated. Dilated.                           - Erythematous mucosa in the gastric fundus and                            gastric body. Biopsied.                           - Normal duodenal bulb and second portion of the                            duodenum. Recommendation:           - Patient has a contact number available for                            emergencies. The signs and symptoms of potential                            delayed complications were discussed with the                            patient. Return to normal activities tomorrow.                            Written discharge instructions were  provided to the                            patient.                           - Clear liquid diet for 2 hours, then advance as                            tolerated to soft diet today.                           - Resume prior diet tomorrow.                           - Follow antireflux measures.                           -  Continue present medications.                           - Increase omeprazole to 20 mg po bid, 1 year of                            refills                           - Await pathology results. Ladene Artist, MD 02/26/2018 3:04:43 PM This report has been signed electronically.

## 2018-02-26 NOTE — Progress Notes (Signed)
I have reviewed the patient's medical history in detail and updated the computerized patient record.

## 2018-02-26 NOTE — Progress Notes (Signed)
Called to room to assist during endoscopic procedure.  Patient ID and intended procedure confirmed with present staff. Received instructions for my participation in the procedure from the performing physician.  

## 2018-02-26 NOTE — Patient Instructions (Signed)
YOU HAD AN ENDOSCOPIC PROCEDURE TODAY AT [facility]:   Refer to the procedure report that was given to you for any specific questions about what was found during the examination.  If the procedure report does not answer your questions, please call your gastroenterologist to clarify.  If you requested that your care partner not be given the details of your procedure findings, then the procedure report has been included in a sealed envelope for you to review at your convenience later.  YOU SHOULD EXPECT: Some feelings of bloating in the abdomen. Passage of more gas than usual.  Walking can help get rid of the air that was put into your GI tract during the procedure and reduce the bloating. If you had a lower endoscopy (such as a colonoscopy or flexible sigmoidoscopy) you may notice spotting of blood in your stool or on the toilet paper. If you underwent a bowel prep for your procedure, you may not have a normal bowel movement for a few days.  Please Note:  You might notice some irritation and congestion in your nose or some drainage.  This is from the oxygen used during your procedure.  There is no need for concern and it should clear up in a day or so.  SYMPTOMS TO REPORT IMMEDIATELY:   Following lower endoscopy (colonoscopy or flexible sigmoidoscopy):  Excessive amounts of blood in the stool  Significant tenderness or worsening of abdominal pains  Swelling of the abdomen that is new, acute  Fever of 100F or higher   Following upper endoscopy (EGD)  Vomiting of blood or coffee ground material  New chest pain or pain under the shoulder blades  Painful or persistently difficult swallowing  New shortness of breath  Fever of 100F or higher  Black, tarry-looking stools  For urgent or emergent issues, a gastroenterologist can be reached at any hour by calling [phone].   DIET:  Please follow the dilatation diet the rest of the day.  Handout was given to your sister.   Drink plenty of fluids but you should avoid alcoholic beverages for 24 hours.  ACTIVITY:  You should plan to take it easy for the rest of today and you should NOT DRIVE or use heavy machinery until tomorrow (because of the sedation medicines used during the test).    FOLLOW UP: Our staff will call the number listed on your records the next business day following your procedure to check on you and address any questions or concerns that you may have regarding the information given to you following your procedure. If we do not reach you, we will leave a message.  However, if you are feeling well and you are not experiencing any problems, there is no need to return our call.  We will assume that you have returned to your regular daily activities without incident.  If any biopsies were taken you will be contacted by phone or by letter within the next 1-3 weeks.  Please call us at [phone] if you have not heard about the biopsies in 3 weeks.    SIGNATURES/CONFIDENTIALITY: You and/or your care partner have signed paperwork which will be entered into your electronic medical record.  These signatures attest to the fact that that the information above on your After Visit Summary has been reviewed and is understood.  Full responsibility of the confidentiality of this discharge information lies with you and/or your care-partner.   Handouts were given to your care partner on esophageal dilatation  diet to follow the rest of today, antireflux measures,  polyps, diverticulosis, hemorrhoids and a high fiber diet with liberal fluid intake. Your blood sugar was 114 in the recovery room. You may resume your current medications today. Await biopsy results. Please call if any questions or concerns.

## 2018-02-27 ENCOUNTER — Telehealth: Payer: Self-pay

## 2018-02-27 NOTE — Telephone Encounter (Signed)
Called [phone] and left a messaged we tried to reach pt for a follow up call. maw

## 2018-02-28 ENCOUNTER — Telehealth: Payer: Self-pay

## 2018-02-28 NOTE — Telephone Encounter (Signed)
Received fax from [place] that PA for omeprazole 20 mg twice daily was approved effective 11/20/17-11/19/18.

## 2018-03-11 ENCOUNTER — Other Ambulatory Visit: Payer: Self-pay

## 2018-03-11 MED ORDER — BIS SUBCIT-METRONID-TETRACYC 140-125-125 MG PO CAPS: 3.0000 | ORAL_CAPSULE | Freq: Three times a day (TID) | ORAL | 0 refills | Status: DC

## 2018-03-12 ENCOUNTER — Telehealth: Payer: Self-pay | Admitting: Gastroenterology

## 2018-03-12 MED ORDER — METRONIDAZOLE 250 MG PO TABS: 250.0000 mg | ORAL_TABLET | Freq: Four times a day (QID) | ORAL | 0 refills | Status: AC

## 2018-03-12 MED ORDER — BISMUTH SUBSALICYLATE 262 MG PO CHEW: 524.0000 mg | CHEWABLE_TABLET | Freq: Four times a day (QID) | ORAL | 0 refills | Status: DC

## 2018-03-12 MED ORDER — DOXYCYCLINE HYCLATE 100 MG PO CAPS: 100.0000 mg | ORAL_CAPSULE | Freq: Two times a day (BID) | ORAL | 0 refills | Status: DC

## 2018-03-12 NOTE — Telephone Encounter (Signed)
Informed patient that I sent a break down of the ingredients to the pharmacy and to take them along with omeprazole twice daily that was already sent to the pharmacy. Patient verbalized understanding.

## 2018-03-12 NOTE — Telephone Encounter (Signed)
Left a message for patient for patient to return my call.

## 2018-05-06 DIAGNOSIS — E7800 Pure hypercholesterolemia, unspecified: Secondary | ICD-10-CM | POA: Diagnosis not present

## 2018-05-06 DIAGNOSIS — Z125 Encounter for screening for malignant neoplasm of prostate: Secondary | ICD-10-CM | POA: Diagnosis not present

## 2018-05-06 DIAGNOSIS — E1151 Type 2 diabetes mellitus with diabetic peripheral angiopathy without gangrene: Secondary | ICD-10-CM | POA: Diagnosis not present

## 2018-05-06 DIAGNOSIS — I10 Essential (primary) hypertension: Secondary | ICD-10-CM | POA: Diagnosis not present

## 2018-05-06 DIAGNOSIS — R82998 Other abnormal findings in urine: Secondary | ICD-10-CM | POA: Diagnosis not present

## 2018-05-10 DIAGNOSIS — I6789 Other cerebrovascular disease: Secondary | ICD-10-CM | POA: Diagnosis not present

## 2018-05-10 DIAGNOSIS — H3530 Unspecified macular degeneration: Secondary | ICD-10-CM | POA: Diagnosis not present

## 2018-05-10 DIAGNOSIS — K760 Fatty (change of) liver, not elsewhere classified: Secondary | ICD-10-CM | POA: Diagnosis not present

## 2018-05-10 DIAGNOSIS — E7800 Pure hypercholesterolemia, unspecified: Secondary | ICD-10-CM | POA: Diagnosis not present

## 2018-05-10 DIAGNOSIS — E1151 Type 2 diabetes mellitus with diabetic peripheral angiopathy without gangrene: Secondary | ICD-10-CM | POA: Diagnosis not present

## 2018-05-10 DIAGNOSIS — R69 Illness, unspecified: Secondary | ICD-10-CM | POA: Diagnosis not present

## 2018-05-10 DIAGNOSIS — I493 Ventricular premature depolarization: Secondary | ICD-10-CM | POA: Diagnosis not present

## 2018-05-10 DIAGNOSIS — I10 Essential (primary) hypertension: Secondary | ICD-10-CM | POA: Diagnosis not present

## 2018-05-10 DIAGNOSIS — Z0000 Encounter for general adult medical examination without abnormal findings: Secondary | ICD-10-CM | POA: Diagnosis not present

## 2018-05-10 DIAGNOSIS — D692 Other nonthrombocytopenic purpura: Secondary | ICD-10-CM | POA: Diagnosis not present

## 2018-05-14 DIAGNOSIS — Z1212 Encounter for screening for malignant neoplasm of rectum: Secondary | ICD-10-CM | POA: Diagnosis not present

## 2018-06-06 DIAGNOSIS — H9313 Tinnitus, bilateral: Secondary | ICD-10-CM | POA: Diagnosis not present

## 2018-06-06 DIAGNOSIS — H903 Sensorineural hearing loss, bilateral: Secondary | ICD-10-CM | POA: Diagnosis not present

## 2018-07-10 DIAGNOSIS — H3530 Unspecified macular degeneration: Secondary | ICD-10-CM | POA: Diagnosis not present

## 2018-07-10 DIAGNOSIS — H52223 Regular astigmatism, bilateral: Secondary | ICD-10-CM | POA: Diagnosis not present

## 2018-07-10 DIAGNOSIS — H353131 Nonexudative age-related macular degeneration, bilateral, early dry stage: Secondary | ICD-10-CM | POA: Diagnosis not present

## 2018-07-10 DIAGNOSIS — H5203 Hypermetropia, bilateral: Secondary | ICD-10-CM | POA: Diagnosis not present

## 2018-07-10 DIAGNOSIS — H2513 Age-related nuclear cataract, bilateral: Secondary | ICD-10-CM | POA: Diagnosis not present

## 2018-07-10 DIAGNOSIS — E119 Type 2 diabetes mellitus without complications: Secondary | ICD-10-CM | POA: Diagnosis not present

## 2018-07-10 DIAGNOSIS — H524 Presbyopia: Secondary | ICD-10-CM | POA: Diagnosis not present

## 2018-07-23 DIAGNOSIS — Z0100 Encounter for examination of eyes and vision without abnormal findings: Secondary | ICD-10-CM | POA: Diagnosis not present

## 2018-11-06 DIAGNOSIS — I6789 Other cerebrovascular disease: Secondary | ICD-10-CM | POA: Diagnosis not present

## 2018-11-06 DIAGNOSIS — R69 Illness, unspecified: Secondary | ICD-10-CM | POA: Diagnosis not present

## 2018-11-06 DIAGNOSIS — E7800 Pure hypercholesterolemia, unspecified: Secondary | ICD-10-CM | POA: Diagnosis not present

## 2018-11-06 DIAGNOSIS — Z23 Encounter for immunization: Secondary | ICD-10-CM | POA: Diagnosis not present

## 2018-11-06 DIAGNOSIS — D692 Other nonthrombocytopenic purpura: Secondary | ICD-10-CM | POA: Diagnosis not present

## 2018-11-06 DIAGNOSIS — E1151 Type 2 diabetes mellitus with diabetic peripheral angiopathy without gangrene: Secondary | ICD-10-CM | POA: Diagnosis not present

## 2018-11-06 DIAGNOSIS — M1990 Unspecified osteoarthritis, unspecified site: Secondary | ICD-10-CM | POA: Diagnosis not present

## 2018-11-06 DIAGNOSIS — H3530 Unspecified macular degeneration: Secondary | ICD-10-CM | POA: Diagnosis not present

## 2018-11-06 DIAGNOSIS — I10 Essential (primary) hypertension: Secondary | ICD-10-CM | POA: Diagnosis not present

## 2018-11-06 DIAGNOSIS — K760 Fatty (change of) liver, not elsewhere classified: Secondary | ICD-10-CM | POA: Diagnosis not present

## 2018-12-17 DIAGNOSIS — R079 Chest pain, unspecified: Secondary | ICD-10-CM | POA: Diagnosis not present

## 2018-12-17 DIAGNOSIS — E1151 Type 2 diabetes mellitus with diabetic peripheral angiopathy without gangrene: Secondary | ICD-10-CM | POA: Diagnosis not present

## 2018-12-17 DIAGNOSIS — I679 Cerebrovascular disease, unspecified: Secondary | ICD-10-CM | POA: Diagnosis not present

## 2018-12-17 DIAGNOSIS — Z6833 Body mass index (BMI) 33.0-33.9, adult: Secondary | ICD-10-CM | POA: Diagnosis not present

## 2018-12-17 DIAGNOSIS — I10 Essential (primary) hypertension: Secondary | ICD-10-CM | POA: Diagnosis not present

## 2018-12-23 ENCOUNTER — Ambulatory Visit: Payer: HMO | Admitting: Cardiology

## 2018-12-23 ENCOUNTER — Encounter: Payer: Self-pay | Admitting: Cardiology

## 2018-12-23 VITALS — BP 134/72 | HR 83 | Ht 66.0 in | Wt 207.8 lb

## 2018-12-23 DIAGNOSIS — E119 Type 2 diabetes mellitus without complications: Secondary | ICD-10-CM

## 2018-12-23 DIAGNOSIS — I2089 Other forms of angina pectoris: Secondary | ICD-10-CM | POA: Insufficient documentation

## 2018-12-23 DIAGNOSIS — E669 Obesity, unspecified: Secondary | ICD-10-CM | POA: Diagnosis not present

## 2018-12-23 DIAGNOSIS — I208 Other forms of angina pectoris: Secondary | ICD-10-CM

## 2018-12-23 DIAGNOSIS — I10 Essential (primary) hypertension: Secondary | ICD-10-CM

## 2018-12-23 DIAGNOSIS — Z136 Encounter for screening for cardiovascular disorders: Secondary | ICD-10-CM | POA: Diagnosis not present

## 2018-12-23 MED ORDER — NITROGLYCERIN 0.4 MG SL SUBL: 0.4000 mg | SUBLINGUAL_TABLET | SUBLINGUAL | 1 refills | Status: DC | PRN

## 2018-12-23 MED ORDER — ISOSORBIDE MONONITRATE ER 30 MG PO TB24: 30.0000 mg | ORAL_TABLET | Freq: Every day | ORAL | 3 refills | Status: DC

## 2018-12-23 MED ORDER — ASPIRIN EC 81 MG PO TBEC: 81.0000 mg | DELAYED_RELEASE_TABLET | Freq: Every day | ORAL | 3 refills | Status: DC

## 2018-12-23 NOTE — Progress Notes (Signed)
Patient referred by Haywood Pao for Chest pain.   Subjective:   '@Patient'$  ID: Timothy [name], male    DOB: Oct 31, 1941, 78 y.o.   MRN: [id number]  Chief Complaint  Patient presents with  . Chest Pain    HPI   78 y/o Caucasian male with hypertension, type 2 DM, former smoker. Patient has been active all his life, including regular running. For the last year or so, he has developed progressive exertional chest pain, and shortness of breath, now occurring every day with minimal physical activity.   Patient has lived in [place] most of his life. He lives by himself. His wife has been sick for last 15 years and lives in a nursing home.   On a separate not, patient underwent EGD and colonoscopy for dysphagia, which was reportesly normal. He was being treated for ?GERD, but did not tolerate the medications. He denies any melena, hematochezia.    Labs 12/17/2018: Glucose 91. BUN/Cr 17/1.2. eGFR 58/70. Na/K 141/4.1 H/H 12/39. MCV 85. Platelets 170.    Past Medical History:  Diagnosis Date  . Diabetes mellitus without complication ([place])   . Elevated cholesterol   . GERD (gastroesophageal reflux disease)   . Hyperplastic colonic polyp    2010  . Hypertension   . Sleep apnea    no cpap  . Tubular adenoma of colon    2010    Past Surgical History:  Procedure Laterality Date  . CARPAL TUNNEL RELEASE Right 09/11/2017   Procedure: RIGHT CARPAL TUNNEL RELEASE;  Surgeon: Daryll Brod, MD;  Location: [place];  Service: Orthopedics;  Laterality: Right;  . ROTATOR CUFF REPAIR      Social History   Socioeconomic History  . Marital status: Widowed    Spouse name: Not on file  . Number of children: 2  . Years of education: Not on file  . Highest education level: Not on file  Occupational History  . Occupation: Retired                                    . Smoking status: Former Smoker    Packs/day: 1.00    Types: Cigarettes    Last attempt to  quit: 1975    Years since quitting: 45.1  . Smokeless tobacco: Never Used  Substance and Sexual Activity  . Alcohol use: Yes    Comment: occasional  . Drug use: No  . Sexual activity: Not on file   Current Outpatient Medications on File Prior to Visit  Medication Sig Dispense Refill  . amLODipine (NORVASC) 10 MG tablet Take 10 mg by mouth at bedtime.    Marland Kitchen diltiazem (TIAZAC) 240 MG 24 hr capsule Take 240 mg by mouth daily.    . hydrochlorothiazide (HYDRODIURIL) 25 MG tablet Take 25 mg by mouth daily.    Marland Kitchen lisinopril (PRINIVIL,ZESTRIL) 40 MG tablet Take 40 mg by mouth daily.    . metFORMIN (GLUCOPHAGE) 850 MG tablet Take 850 mg by mouth 2 (two) times daily with a meal.    . omeprazole (PRILOSEC) 20 MG capsule Take 1 capsule (20 mg total) by mouth 2 (two) times daily. Take on an empty stomach 20 - 30 minutes prior to food. (Patient taking differently: Take 20 mg by mouth daily. Take on an empty stomach 20 - 30 minutes prior to food.) 180 capsule 3  . omeprazole (PRILOSEC) 20 MG capsule Take  20 mg by mouth once.    . pravastatin (PRAVACHOL) 20 MG tablet Take 20 mg by mouth daily.    . sertraline (ZOLOFT) 100 MG tablet Take 100 mg by mouth daily.    Marland Kitchen bismuth subsalicylate (PEPTO-BISMOL) 262 MG chewable tablet Chew 2 tablets (524 mg total) by mouth 4 (four) times daily. (Patient not taking: Reported on 12/23/2018) 112 tablet 0  . doxycycline (VIBRAMYCIN) 100 MG capsule Take 1 capsule (100 mg total) by mouth 2 (two) times daily. (Patient not taking: Reported on 12/23/2018) 28 capsule 0  . HYDROcodone-acetaminophen (NORCO) 5-325 MG tablet Take 1 tablet by mouth every 6 (six) hours as needed. (Patient not taking: Reported on 02/26/2018) 20 tablet 0  . naproxen sodium (ALEVE) 220 MG tablet Take 220 mg by mouth 2 (two) times daily as needed (pain).    . sucralfate (CARAFATE) 1 GM/10ML suspension Take 10 mLs (1 g total) by mouth 4 (four) times daily. (Patient not taking: Reported on 02/26/2018) 420 mL 1    Current Facility-Administered Medications on File Prior to Visit  Medication Dose Route Frequency Provider Last Rate Last Dose  . 0.9 %  sodium chloride infusion  500 mL Intravenous Once Ladene Artist, MD         Today's Vitals   12/23/18 1240  BP: 134/72  Pulse: 83  SpO2: 97%  Weight: 207 lb 12.8 oz (94.3 kg)  Height: '5\' 6"'$  (1.676 m)   Body mass index is 33.54 kg/m.     Objective:   Physical Exam Vitals signs and nursing note reviewed.  Constitutional:      General: He is not in acute distress.    Appearance: Normal appearance. He is not toxic-appearing.  HENT:     Head: Normocephalic and atraumatic.     Nose: No congestion.     Mouth/Throat:     Mouth: Mucous membranes are dry.  Eyes:     Pupils: Pupils are equal, round, and reactive to light.  Neck:     Musculoskeletal: Neck supple. No muscular tenderness.     Vascular: No JVD.  Cardiovascular:     Pulses: Normal pulses.          Dorsalis pedis pulses are 2+ on the right side and 2+ on the left side.       Posterior tibial pulses are 2+ on the right side and 2+ on the left side.     Heart sounds: Normal heart sounds. No murmur.  Pulmonary:     Effort: Pulmonary effort is normal. No tachypnea or accessory muscle usage.     Breath sounds: Normal breath sounds.  Abdominal:     General: Abdomen is flat. Bowel sounds are normal. There is no distension.     Palpations: Abdomen is soft.  Musculoskeletal:        General: No tenderness.  Lymphadenopathy:     Cervical: No cervical adenopathy.  Skin:    General: Skin is warm and dry.  Neurological:     General: No focal deficit present.     Mental Status: He is alert and oriented to person, place, and time.  Psychiatric:        Mood and Affect: Mood normal.           Assessment & Recommendations:   1. Exertional angina (HCC) -  EKG 12/23/2018: Sinus rhythm, possible old inferior infarct. Nonspecific ST-T changes.   Symptoms are concerning for  typical angina, CCS III. Given his significant limitation with walking activity  due to chest pain, recommend pharmacological/low level exercise nuclear stress test. Will also obtain echocardiogram. Start aspirin, Imdur 30 mg, as needed SL NTG. Continue pravastatin for now. Will obtain lipid panel results, if performed by PCP. May need to switch to high intensity statin in future.   PCV ECHOCARDIOGRAM,  PCV MYOCARDIAL PERFUSION, aspirin EC 81 MG tablet, isosorbide mononitrate (IMDUR) 30 MG 24 hr tablet,  nitroGLYCERIN (NITROSTAT) 0.4 MG SL tablet  Recommend limiting physical activity until further workup. I have shared heart healthy diet recommendations with the patient.   Screening for AAA (abdominal aortic aneurysm) -  Former smoker >65 yrs of age Plan: PCV AORTA DUPLEX  Essential hypertension: Controlled on current medical therapy. He is on two calcium channel blocking agents, but tolerating well. Will continue the same.   Type 2 diabetes mellitus without complication, without long-term current use of insulin ([place]): Continue follow up with PCP.    Thank you for referring the patient to [place]. Please feel free to contact with any questions.  [name], MD [facility] [place] Pager: [phone] Office: [phone] If no answer Cell [phone]

## 2018-12-23 NOTE — Patient Instructions (Signed)

## 2019-01-06 ENCOUNTER — Ambulatory Visit: Payer: HMO

## 2019-01-06 DIAGNOSIS — I200 Unstable angina: Secondary | ICD-10-CM | POA: Diagnosis not present

## 2019-01-06 DIAGNOSIS — I208 Other forms of angina pectoris: Secondary | ICD-10-CM

## 2019-01-15 DIAGNOSIS — E1151 Type 2 diabetes mellitus with diabetic peripheral angiopathy without gangrene: Secondary | ICD-10-CM | POA: Diagnosis not present

## 2019-01-15 DIAGNOSIS — Z6833 Body mass index (BMI) 33.0-33.9, adult: Secondary | ICD-10-CM | POA: Diagnosis not present

## 2019-01-15 DIAGNOSIS — R0789 Other chest pain: Secondary | ICD-10-CM | POA: Diagnosis not present

## 2019-01-15 DIAGNOSIS — I10 Essential (primary) hypertension: Secondary | ICD-10-CM | POA: Diagnosis not present

## 2019-01-16 ENCOUNTER — Telehealth: Payer: Self-pay

## 2019-01-16 NOTE — Telephone Encounter (Signed)
Spoke to pt advised him of normal stress test

## 2019-01-20 IMAGING — DX DG CHEST 2V
2 series · 2 of 2 positions shown · non-contrast
Comparison: Chest x-ray report dated January 17, 1999.

CLINICAL DATA: Intermittent chest pain for the past 2 months.

EXAM:
CHEST  2 VIEW

[chest pa]
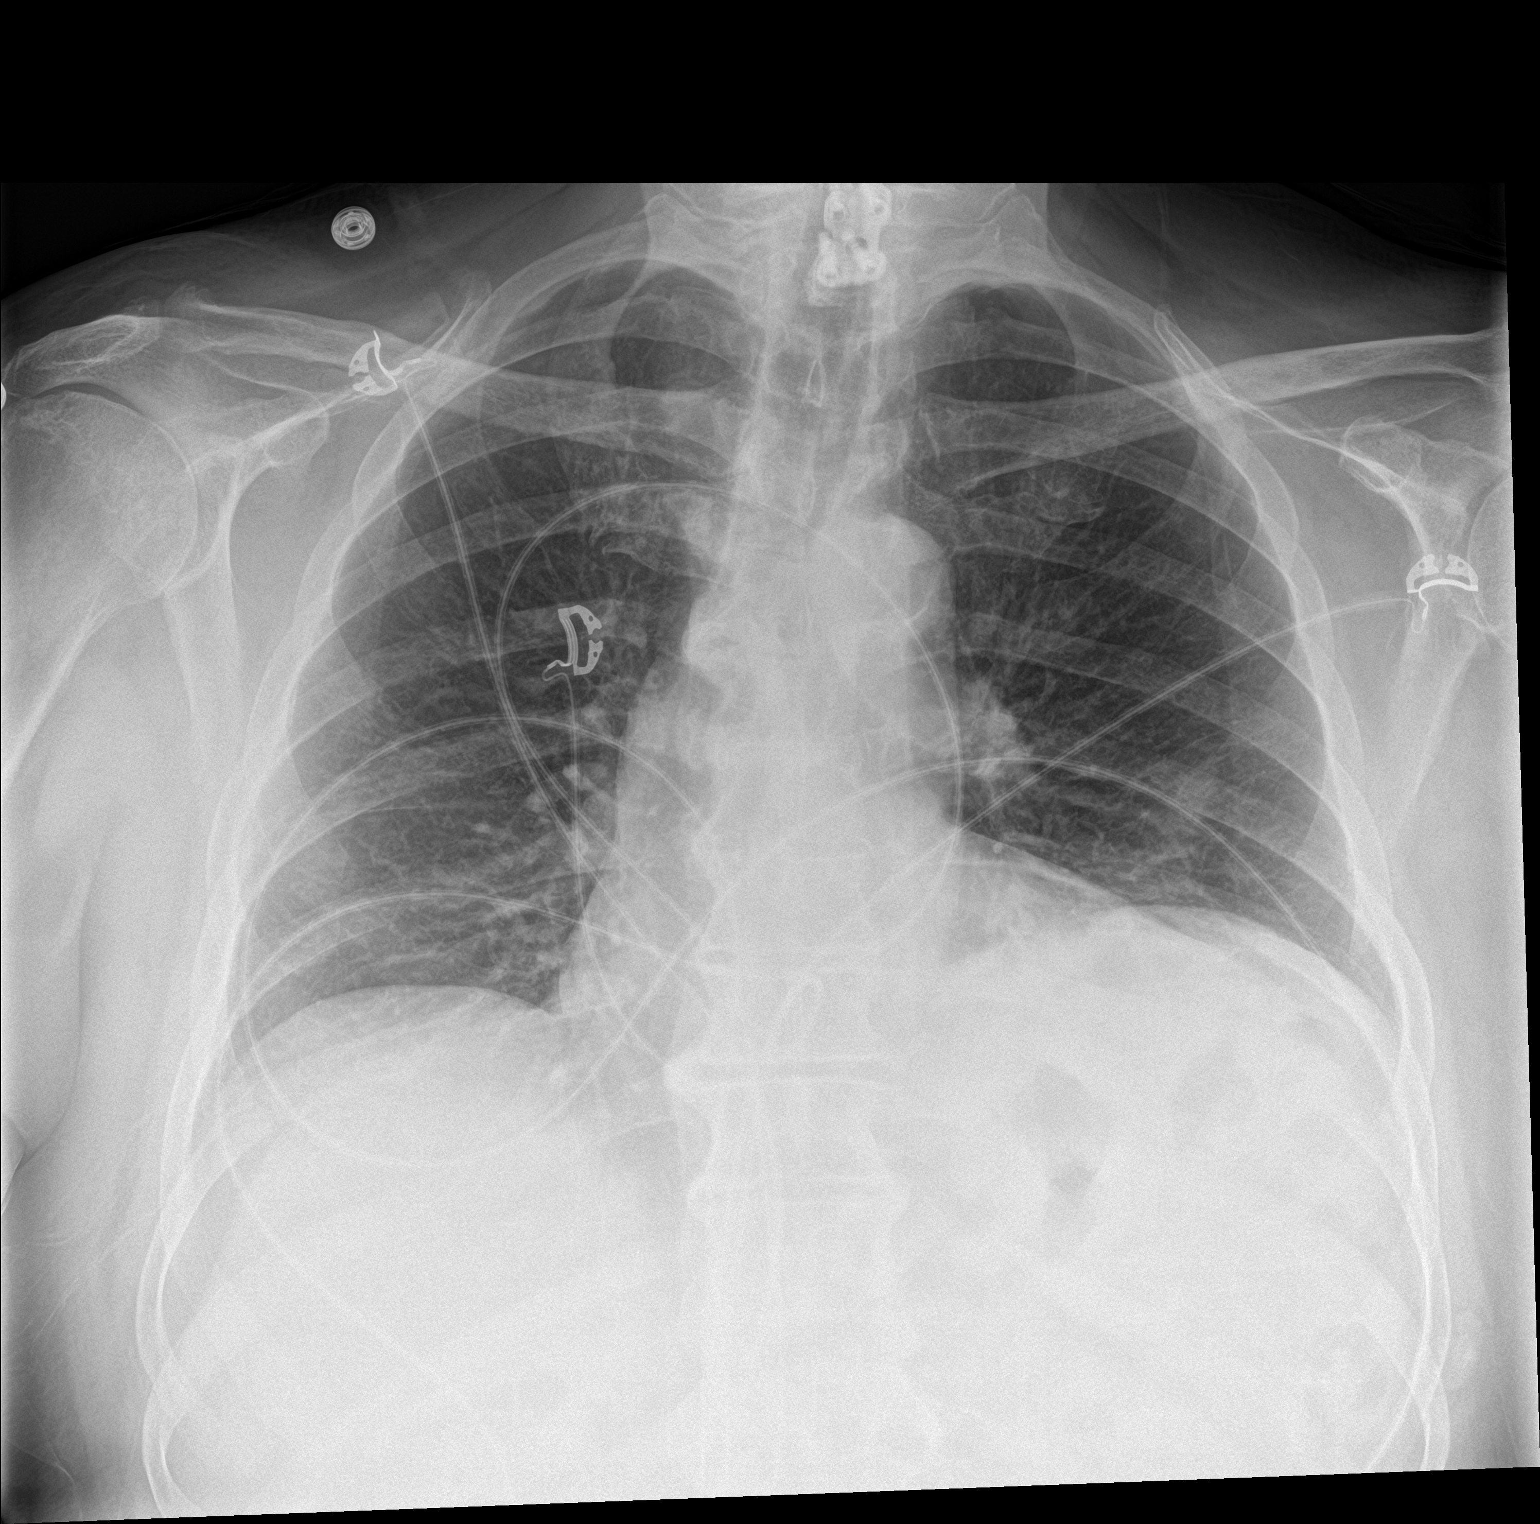

[chest lat]
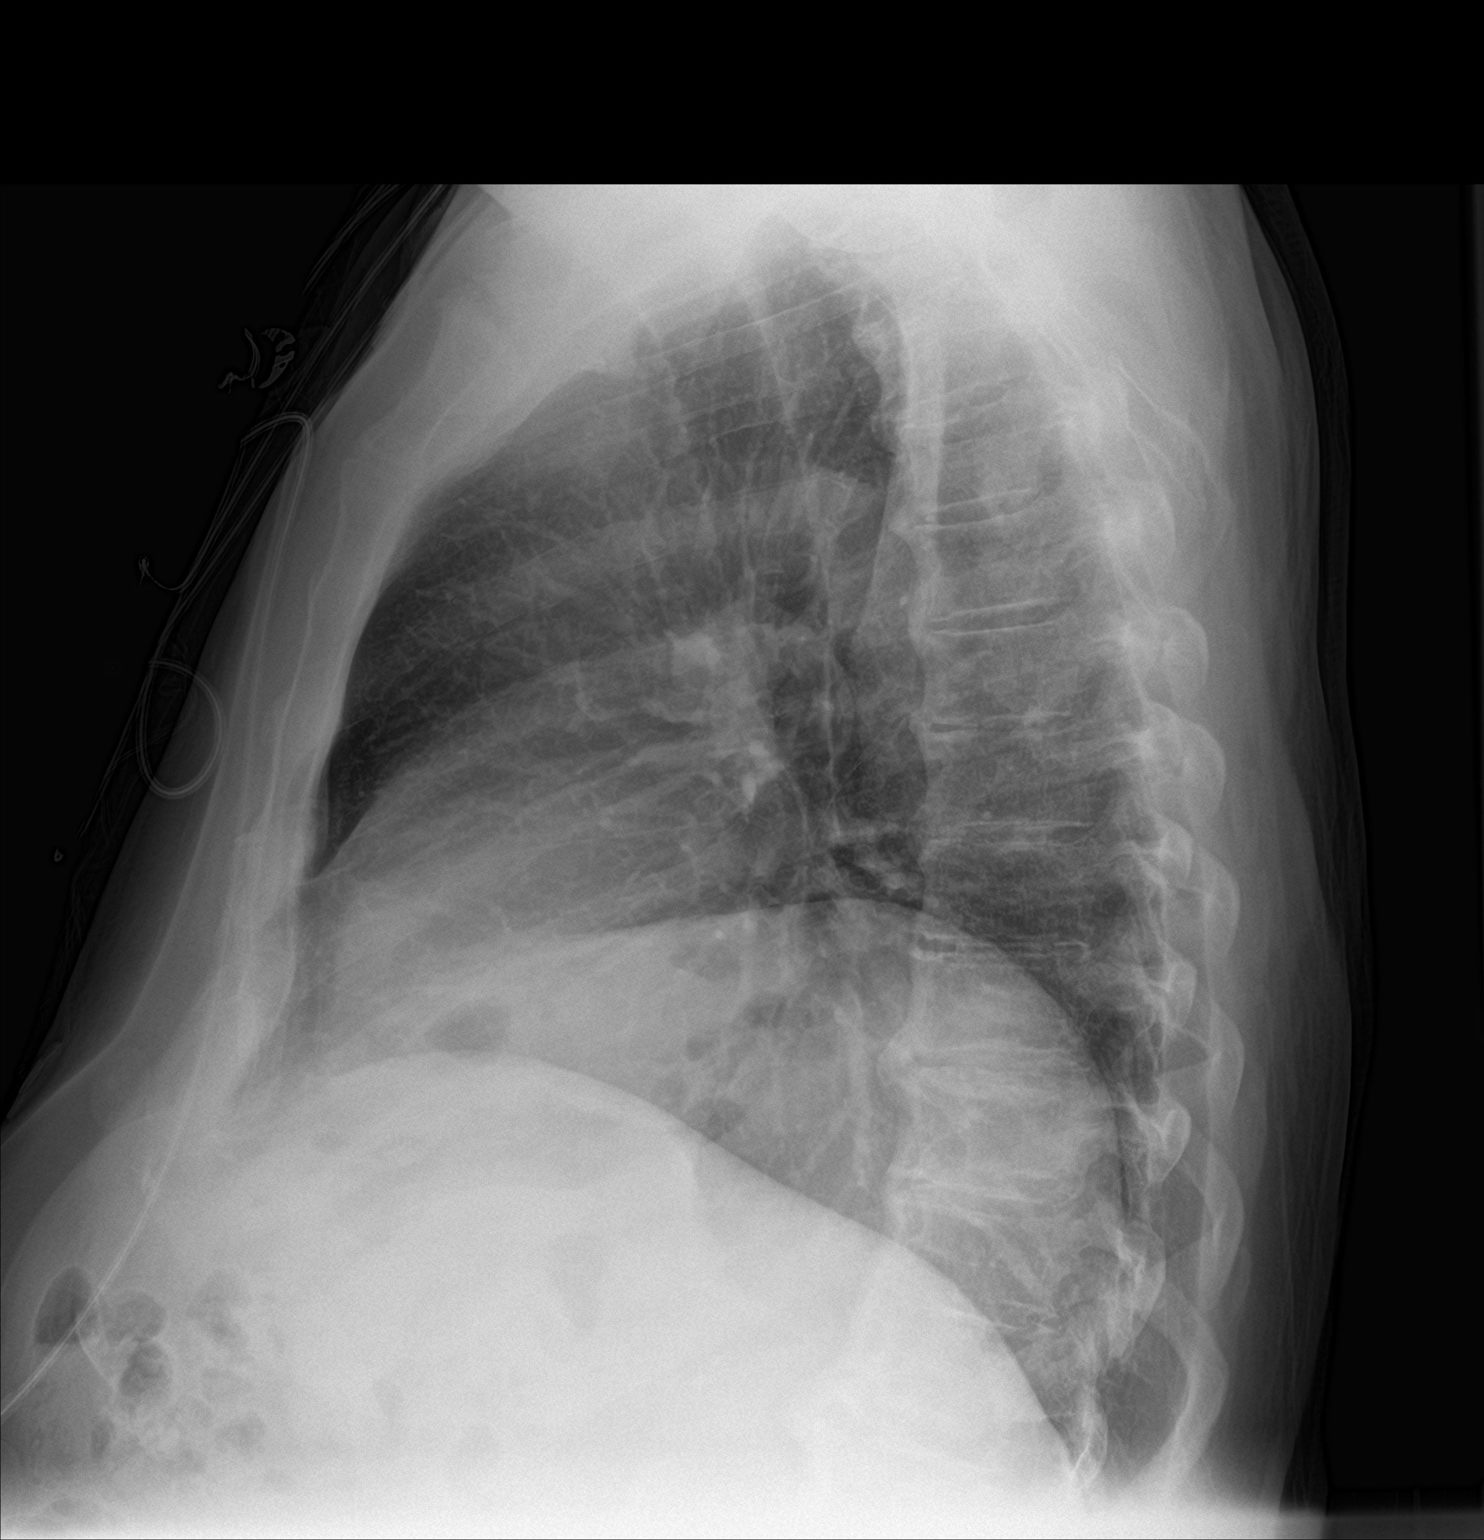

[2 of 2 positions shown; findings below may reference images not displayed]

FINDINGS: The heart size and mediastinal contours are within normal limits.
Normal pulmonary vascularity. Atherosclerotic calcification of the
aortic arch. There is an 11 mm density in the mid to lower left
lung, not clearly seen on the lateral view. Low lung volumes.
Elevation of the left hemidiaphragm with left basilar atelectasis.
No focal consolidation, pleural effusion, or pneumothorax. No acute
osseous abnormality. Lower cervical ACDF.
IMPRESSION: 1. 11 mm nodular density in the mid to lower left lung, not clearly
seen on lateral view. This may represent a nipple shadow. Recommend
repeat PA view with nipple markers.
2. Low lung volumes.  No active cardiopulmonary disease.

## 2019-01-24 ENCOUNTER — Ambulatory Visit: Payer: HMO

## 2019-01-24 DIAGNOSIS — Z136 Encounter for screening for cardiovascular disorders: Secondary | ICD-10-CM

## 2019-01-24 DIAGNOSIS — I208 Other forms of angina pectoris: Secondary | ICD-10-CM

## 2019-01-24 DIAGNOSIS — R079 Chest pain, unspecified: Secondary | ICD-10-CM | POA: Diagnosis not present

## 2019-01-26 NOTE — Progress Notes (Signed)
Patient is here for follow up visit.  Subjective:   Timothy [name], male    DOB: 1941-10-29, 78 y.o.   MRN: [id number]   Chief Complaint  Patient presents with  . Hypertension  . Chest Pain  . Follow-up    echo results, last EKG 12/23/18    HPI  77 y/o Caucasian male with hypertension, type 2 DM, former smoker, seen for exertional chest pain, and shortness of breath.  Workup with echocardiogram, nuclear stress test was unremarkable. Patient has not had any significant chest pain or shortness of breath sine his last visit.    Past Medical History:  Diagnosis Date  . Diabetes mellitus without complication ([place])   . Elevated cholesterol   . GERD (gastroesophageal reflux disease)   . Hyperplastic colonic polyp    2010  . Hypertension   . Sleep apnea    no cpap  . Tubular adenoma of colon    2010     Past Surgical History:  Procedure Laterality Date  . CARPAL TUNNEL RELEASE Right 09/11/2017   Procedure: RIGHT CARPAL TUNNEL RELEASE;  Surgeon: Daryll Brod, MD;  Location: [place];  Service: Orthopedics;  Laterality: Right;  . ROTATOR CUFF REPAIR       Social History   Socioeconomic History  . Marital status: Widowed    Spouse name: Not on file  . Number of children: 2  . Years of education: Not on file  . Highest education level: Not on file  Occupational History  . Occupation: Retired  Scientific laboratory technician  . Financial resource strain: Not on file  . Food insecurity:    Worry: Not on file    Inability: Not on file  . Transportation needs:    Medical: Not on file    Non-medical: Not on file  Tobacco Use  . Smoking status: Former Smoker    Packs/day: 1.00    Types: Cigarettes    Last attempt to quit: 1975    Years since quitting: 45.2  . Smokeless tobacco: Never Used  Substance and Sexual Activity  . Alcohol use: Yes    Comment: occasional  . Drug use: No  . Sexual activity: Not on file  Lifestyle  . Physical activity:    Days per  week: Not on file    Minutes per session: Not on file  . Stress: Not on file  Relationships  . Social connections:    Talks on phone: Not on file    Gets together: Not on file    Attends religious service: Not on file    Active member of club or organization: Not on file    Attends meetings of clubs or organizations: Not on file    Relationship status: Not on file  . Intimate partner violence:    Fear of current or ex partner: Not on file    Emotionally abused: Not on file    Physically abused: Not on file    Forced sexual activity: Not on file  Other Topics Concern  . Not on file  Social History Narrative  . Not on file     Current Outpatient Medications on File Prior to Visit  Medication Sig Dispense Refill  . amLODipine (NORVASC) 10 MG tablet Take 10 mg by mouth at bedtime.    Marland Kitchen aspirin EC 81 MG tablet Take 1 tablet (81 mg total) by mouth daily. 90 tablet 3  . diltiazem (TIAZAC) 240 MG 24 hr capsule Take 240 mg by  mouth daily.    . hydrochlorothiazide (HYDRODIURIL) 25 MG tablet Take 25 mg by mouth daily.    . isosorbide mononitrate (IMDUR) 30 MG 24 hr tablet Take 1 tablet (30 mg total) by mouth daily. 90 tablet 3  . lisinopril (PRINIVIL,ZESTRIL) 40 MG tablet Take 40 mg by mouth daily.    . metFORMIN (GLUCOPHAGE) 850 MG tablet Take 850 mg by mouth 2 (two) times daily with a meal.    . naproxen sodium (ALEVE) 220 MG tablet Take 220 mg by mouth 2 (two) times daily as needed (pain).    . nitroGLYCERIN (NITROSTAT) 0.4 MG SL tablet Place 1 tablet (0.4 mg total) under the tongue every 5 (five) minutes as needed for chest pain. 30 tablet 1  . omeprazole (PRILOSEC) 20 MG capsule Take 1 capsule (20 mg total) by mouth 2 (two) times daily. Take on an empty stomach 20 - 30 minutes prior to food. (Patient taking differently: Take 20 mg by mouth daily. Take on an empty stomach 20 - 30 minutes prior to food.) 180 capsule 3  . pravastatin (PRAVACHOL) 20 MG tablet Take 20 mg by mouth daily.    .  sertraline (ZOLOFT) 100 MG tablet Take 100 mg by mouth daily.    Marland Kitchen bismuth subsalicylate (PEPTO-BISMOL) 262 MG chewable tablet Chew 2 tablets (524 mg total) by mouth 4 (four) times daily. (Patient not taking: Reported on 12/23/2018) 112 tablet 0  . doxycycline (VIBRAMYCIN) 100 MG capsule Take 1 capsule (100 mg total) by mouth 2 (two) times daily. (Patient not taking: Reported on 12/23/2018) 28 capsule 0  . HYDROcodone-acetaminophen (NORCO) 5-325 MG tablet Take 1 tablet by mouth every 6 (six) hours as needed. (Patient not taking: Reported on 02/26/2018) 20 tablet 0  . omeprazole (PRILOSEC) 20 MG capsule Take 20 mg by mouth once.    . sucralfate (CARAFATE) 1 GM/10ML suspension Take 10 mLs (1 g total) by mouth 4 (four) times daily. (Patient not taking: Reported on 02/26/2018) 420 mL 1   Current Facility-Administered Medications on File Prior to Visit  Medication Dose Route Frequency Provider Last Rate Last Dose  . 0.9 %  sodium chloride infusion  500 mL Intravenous Once Ladene Artist, MD        Cardiovascular studies:  Leane Call stress test 01/06/2019:  1. Lexiscan stress test with low level exercise was performed. Patient reached heart rate of 112 bpm which corresponds with 79% of the maximum predicted heart rate  No stress symptoms reported. Hemodynamic response was normal. In additions, the stress electrocardiogram showed sinus tachycardia, normal stress conduction, no stress arrhythmias and normal stress repolarization.   2. The overall quality of the study is good. There is no evidence of abnormal lung activity. Stress and rest SPECT images demonstrate homogeneous tracer distribution throughout the myocardium. Gated SPECT imaging reveals normal myocardial thickening and wall motion. The left ventricular ejection fraction was normal (51%).   3. Low risk study.   Abdominal Aortic Duplex  01/24/2019 The maximum aorta diameter is 1.86 cm (dist). No evidence of significant atherosclerotic plaque.  Normal aortic and iliac artery veloxity. Normal IVC.  Echocardiogram 01/24/2019 :  Left ventricle cavity is normal in size. Moderate concentric hypertrophy of the left ventricle. Normal global wall motion. Normal diastolic filling pattern. Calculated EF 55%. Left atrial cavity is slightly dilated at 4.5 cm. Structurally normal tricuspid valve with trace regurgitation. The aortic root is mildly dilated at 3.9 cm.   Recent labs: Labs 12/11/2018: Glucose 97. BUN/Cr 22/0.8. eGFR  69/83.  Chol 140, TG 126, HDL 61, LDL 54.  Review of Systems  Constitution: Negative for decreased appetite, malaise/fatigue, weight gain and weight loss.  HENT: Negative for congestion.   Eyes: Negative for visual disturbance.  Cardiovascular: Negative for chest pain, dyspnea on exertion, leg swelling, palpitations and syncope.  Respiratory: Negative for shortness of breath.   Endocrine: Negative for cold intolerance.  Hematologic/Lymphatic: Does not bruise/bleed easily.  Skin: Negative for itching and rash.  Musculoskeletal: Negative for myalgias.  Gastrointestinal: Negative for abdominal pain, nausea and vomiting.  Genitourinary: Negative for dysuria.  Neurological: Negative for dizziness and weakness.  Psychiatric/Behavioral: The patient is not nervous/anxious.   All other systems reviewed and are negative.      Objective:    Vitals:   01/29/19 1048  BP: 135/69  Pulse: 88  SpO2: 95%     Physical Exam  Constitutional: He is oriented to person, place, and time. He appears well-developed and well-nourished. No distress.  HENT:  Head: Normocephalic and atraumatic.  Eyes: Pupils are equal, round, and reactive to light. Conjunctivae are normal.  Neck: No JVD present.  Cardiovascular: Normal rate, regular rhythm and intact distal pulses.  No murmur heard. Pulmonary/Chest: Effort normal and breath sounds normal. He has no wheezes. He has no rales.  Abdominal: Soft. Bowel sounds are normal. There is  no rebound.  Musculoskeletal:        General: No edema.  Lymphadenopathy:    He has no cervical adenopathy.  Neurological: He is alert and oriented to person, place, and time. No cranial nerve deficit.  Skin: Skin is warm and dry.  Psychiatric: He has a normal mood and affect.  Nursing note and vitals reviewed.       Assessment & Recommendations:    78 y/o Caucasian male with hypertension, type 2 DM, former smoker, seen for exertional chest pain, and shortness of breath.  1. Exertional chest pain/Exertional dyspnea Reassuring cardiac work-up.  His symptoms have improved on isosorbide 30 mg daily.  In future, if he has any recurrence of chest pain or shortness of breath, then recommend cardiac catheterization.  Continue medical management at this time.  Continue aspirin and statin.  Lipid panel well controlled.  2. Essential hypertension Blood pressure is well controlled.  He is on amlodipine and diltiazem, but tolerating it well without any significant side effects.  No change made to his medical regimen today.  I will see him back in 6 months. Reassuring cardiac wokrup  [name], MD [facility]. [place] Pager: [phone] Office: [phone] If no answer Cell [phone]

## 2019-01-29 ENCOUNTER — Ambulatory Visit: Payer: HMO | Admitting: Cardiology

## 2019-01-29 ENCOUNTER — Other Ambulatory Visit: Payer: Self-pay

## 2019-01-29 ENCOUNTER — Encounter: Payer: Self-pay | Admitting: Cardiology

## 2019-01-29 VITALS — BP 135/69 | HR 88 | Ht 66.0 in | Wt 209.3 lb

## 2019-01-29 DIAGNOSIS — R0609 Other forms of dyspnea: Secondary | ICD-10-CM

## 2019-01-29 DIAGNOSIS — R079 Chest pain, unspecified: Secondary | ICD-10-CM

## 2019-01-29 DIAGNOSIS — R0600 Dyspnea, unspecified: Secondary | ICD-10-CM

## 2019-01-29 DIAGNOSIS — I10 Essential (primary) hypertension: Secondary | ICD-10-CM

## 2019-01-31 ENCOUNTER — Other Ambulatory Visit: Payer: Self-pay

## 2019-01-31 NOTE — Patient Outreach (Signed)
  [facility]) Care Management Chronic Special Needs Program   01/31/2019  Name: Timothy [name], Timothy [name]: 1941/05/18  MRN: [id number]  The client was discussed in 01/29/19's interdisciplinary care team meeting. The client's individualized care plan was developed based on completed Health Risk Assessment  The following issues were discussed:  Key risk triggers/risk stratification and Care Plan  Participants present:   Mahlon Gammon, MSN, RN, CCM, CNS    Thea Silversmith, MSN, RN, CCM   Melissa Sandlin RN,BSN,CCM, CDE       Recommendations: Send education on HTN  Plan:  Plan to send copy of individualized care plan to client Plan to send individualized care plan to provider. Chronic Care Management Coordinator will outreach in 2-4 months   Follow-up:  2-4 months  [name] RN, Jackquline Denmark, CDE Chronic Care Management Coordinator [facility] Management [phone]

## 2019-03-17 DIAGNOSIS — N182 Chronic kidney disease, stage 2 (mild): Secondary | ICD-10-CM | POA: Diagnosis not present

## 2019-03-17 DIAGNOSIS — I129 Hypertensive chronic kidney disease with stage 1 through stage 4 chronic kidney disease, or unspecified chronic kidney disease: Secondary | ICD-10-CM | POA: Diagnosis not present

## 2019-03-17 DIAGNOSIS — E1151 Type 2 diabetes mellitus with diabetic peripheral angiopathy without gangrene: Secondary | ICD-10-CM | POA: Diagnosis not present

## 2019-04-15 DIAGNOSIS — N182 Chronic kidney disease, stage 2 (mild): Secondary | ICD-10-CM | POA: Diagnosis not present

## 2019-04-15 DIAGNOSIS — I129 Hypertensive chronic kidney disease with stage 1 through stage 4 chronic kidney disease, or unspecified chronic kidney disease: Secondary | ICD-10-CM | POA: Diagnosis not present

## 2019-04-15 DIAGNOSIS — E1151 Type 2 diabetes mellitus with diabetic peripheral angiopathy without gangrene: Secondary | ICD-10-CM | POA: Diagnosis not present

## 2019-04-18 DIAGNOSIS — E1151 Type 2 diabetes mellitus with diabetic peripheral angiopathy without gangrene: Secondary | ICD-10-CM | POA: Diagnosis not present

## 2019-04-18 DIAGNOSIS — I129 Hypertensive chronic kidney disease with stage 1 through stage 4 chronic kidney disease, or unspecified chronic kidney disease: Secondary | ICD-10-CM | POA: Diagnosis not present

## 2019-05-30 DIAGNOSIS — E1151 Type 2 diabetes mellitus with diabetic peripheral angiopathy without gangrene: Secondary | ICD-10-CM | POA: Diagnosis not present

## 2019-05-30 DIAGNOSIS — Z125 Encounter for screening for malignant neoplasm of prostate: Secondary | ICD-10-CM | POA: Diagnosis not present

## 2019-05-30 DIAGNOSIS — I129 Hypertensive chronic kidney disease with stage 1 through stage 4 chronic kidney disease, or unspecified chronic kidney disease: Secondary | ICD-10-CM | POA: Diagnosis not present

## 2019-05-30 DIAGNOSIS — E7800 Pure hypercholesterolemia, unspecified: Secondary | ICD-10-CM | POA: Diagnosis not present

## 2019-05-30 LAB — HEMOGLOBIN A1C: Hemoglobin A1C: 6.2

## 2019-06-03 ENCOUNTER — Other Ambulatory Visit: Payer: Self-pay

## 2019-06-03 NOTE — Patient Outreach (Signed)
  [facility]) Care Management Chronic Special Needs Program  06/03/2019  Name: Timothy [name] DOB: 1941/10/18  MRN: [id number]  [name] is enrolled in a chronic special needs plan for Diabetes. Client called with no answer No answer and HIPAA compliant message left. 1 st attempt Plan for 2nd outreach call in one week Chronic care management coordinator will attempt outreach in one week.   [name] RN, Jackquline Denmark, CDE Chronic Care Management Coordinator [facility] Care Management [phone]

## 2019-06-06 DIAGNOSIS — Z0000 Encounter for general adult medical examination without abnormal findings: Secondary | ICD-10-CM | POA: Diagnosis not present

## 2019-06-06 DIAGNOSIS — E1151 Type 2 diabetes mellitus with diabetic peripheral angiopathy without gangrene: Secondary | ICD-10-CM | POA: Diagnosis not present

## 2019-06-06 DIAGNOSIS — N182 Chronic kidney disease, stage 2 (mild): Secondary | ICD-10-CM | POA: Diagnosis not present

## 2019-06-06 DIAGNOSIS — D692 Other nonthrombocytopenic purpura: Secondary | ICD-10-CM | POA: Diagnosis not present

## 2019-06-06 DIAGNOSIS — H3530 Unspecified macular degeneration: Secondary | ICD-10-CM | POA: Diagnosis not present

## 2019-06-06 DIAGNOSIS — F331 Major depressive disorder, recurrent, moderate: Secondary | ICD-10-CM | POA: Diagnosis not present

## 2019-06-06 DIAGNOSIS — H9193 Unspecified hearing loss, bilateral: Secondary | ICD-10-CM | POA: Diagnosis not present

## 2019-06-06 DIAGNOSIS — I129 Hypertensive chronic kidney disease with stage 1 through stage 4 chronic kidney disease, or unspecified chronic kidney disease: Secondary | ICD-10-CM | POA: Diagnosis not present

## 2019-06-06 DIAGNOSIS — Z1331 Encounter for screening for depression: Secondary | ICD-10-CM | POA: Diagnosis not present

## 2019-06-06 DIAGNOSIS — I679 Cerebrovascular disease, unspecified: Secondary | ICD-10-CM | POA: Diagnosis not present

## 2019-06-06 DIAGNOSIS — I209 Angina pectoris, unspecified: Secondary | ICD-10-CM | POA: Diagnosis not present

## 2019-06-06 DIAGNOSIS — I493 Ventricular premature depolarization: Secondary | ICD-10-CM | POA: Diagnosis not present

## 2019-06-06 DIAGNOSIS — D126 Benign neoplasm of colon, unspecified: Secondary | ICD-10-CM | POA: Diagnosis not present

## 2019-06-09 ENCOUNTER — Other Ambulatory Visit: Payer: Self-pay

## 2019-06-09 NOTE — Patient Outreach (Signed)
  [place] [facility]) Care Management Chronic Special Needs Program  06/09/2019  Name: Timothy [name] DOB: [birth year]  MRN: [id number]  [name] is enrolled in a chronic special needs plan for Diabetes. Chronic Care Management Coordinator telephoned client to review health risk assessment and to develop individualized care plan.  Introduced the chronic care management program, importance of client participation, and taking their care plan to all provider appointments and inpatient facilities.  Reviewed the transition of care process and possible referral to community care management.  Subjective: Client states he had a telephone visit with his doctor last week.  States his lab work was good.  States he tries to stay active and he walks several times a week.  States he tries to watch what he eats and tries to eat healthy.  States his blood sugars range 120-135.  Denies any chest pains.  States his blood pressure has been good with his medicaiton  Goals Addressed            This Visit's Progress   . Client understands the importance of follow-up with providers by attending scheduled visits   On track   . Client will use Assistive Devices as needed and verbalize understanding of device use   On track   . Client will verbalize knowledge of self management of Hypertension as evidences by BP reading of 140/90 or less; or as defined by provider   On track   . HEMOGLOBIN A1C < 7.0       Diabetes self management actions:  Glucose monitoring per provider recommendations  Eat Healthy  Check feet daily  Visit provider every 3-6 months as directed  Hbg A1C level every 3-6 months.  Eye Exam yearly    . Maintain timely refills of diabetic medication as prescribed within the year .   On track   . COMPLETED: Obtain annual  Lipid Profile, LDL-C       Completed 05/30/19    . Obtain Annual Eye (retinal)  Exam    On track   . Obtain Annual Foot Exam   On track   . Obtain  annual screen for micro albuminuria (urine) , nephropathy (kidney problems)   On track   . Obtain Hemoglobin A1C at least 2 times per year   On track    Checked 05/30/19    . Visit Primary Care Provider or Endocrinologist at least 2 times per year    On track    Client is meeting diabetes self management goal of hemoglobin A1C of <7% with last reading of 6.2% Instructed to follow a low CHO, low sodium diet and reviewed portion sizes Encouraged to continue to stay active and to use his Silver Sneakers benefit when the gyms reopen for Senior classes Reviewed number for 24 hour nurse Line Discussed COVID19 cause, symptoms, precautions (social distancing, stay at home order, hand washing), confirmed client knows how to contact provider.  Plan:  Send successful outreach letter with a copy of their individualized care plan and Send individual care plan to provider  Chronic care management coordination will outreach in:  5-6 Months     Timothy Garter RN, [facility], [place] Management Coordinator [place] Management [phone]

## 2019-06-13 DIAGNOSIS — H353112 Nonexudative age-related macular degeneration, right eye, intermediate dry stage: Secondary | ICD-10-CM | POA: Diagnosis not present

## 2019-07-14 DIAGNOSIS — H2513 Age-related nuclear cataract, bilateral: Secondary | ICD-10-CM | POA: Diagnosis not present

## 2019-07-14 DIAGNOSIS — H43813 Vitreous degeneration, bilateral: Secondary | ICD-10-CM | POA: Diagnosis not present

## 2019-07-14 DIAGNOSIS — H35433 Paving stone degeneration of retina, bilateral: Secondary | ICD-10-CM | POA: Diagnosis not present

## 2019-07-14 DIAGNOSIS — H353132 Nonexudative age-related macular degeneration, bilateral, intermediate dry stage: Secondary | ICD-10-CM | POA: Diagnosis not present

## 2019-08-03 NOTE — Progress Notes (Signed)
Patient is here for follow up visit.  Subjective:   Timothy [name], male    DOB: [birth year], 78 y.o.   MRN: [id number]   Chief Complaint  Patient presents with  . Hypertension  . Chest Pain  . Follow-up    6 month    HPI  78 y/o Caucasian male with hypertension, type 2 DM, former smoker, seen for exertional chest pain, and shortness of breath.  Patient's echocardiogram did not show any significant abnormalities to explain his symptoms.  On exercise nuclear stress test, there was no evidence of ischemia.  However, patient only achieved 79% of his age-predicted maximum heart rate.  As result, stress test was submaximal.  Patient is here for 6 month follow up.  He continues to have symptoms of exertional dyspnea and chest tightness that predictably improves with rest.  He is on good medical therapy with reasonably well-controlled blood pressure.  Past Medical History:  Diagnosis Date  . Diabetes mellitus without complication ([place])   . Elevated cholesterol   . GERD (gastroesophageal reflux disease)   . Hyperplastic colonic polyp    2010  . Hypertension   . Sleep apnea    no cpap  . Tubular adenoma of colon    2010     Past Surgical History:  Procedure Laterality Date  . CARPAL TUNNEL RELEASE Right 09/11/2017   Procedure: RIGHT CARPAL TUNNEL RELEASE;  Surgeon: Daryll Brod, MD;  Location: [facility];  Service: Orthopedics;  Laterality: Right;  . ROTATOR CUFF REPAIR       Social History   Socioeconomic History  . Marital status: Widowed    Spouse name: Not on file  . Number of children: 2  . Years of education: Not on file  . Highest education level: Not on file  Occupational History  . Occupation: Retired  Scientific laboratory technician  . Financial resource strain: Not on file  . Food insecurity    Worry: Never true    Inability: Never true  . Transportation needs    Medical: No    Non-medical: No  Tobacco Use  . Smoking status: Former Smoker   Packs/day: 1.00    Types: Cigarettes    Quit date: 1975    Years since quitting: 45.7  . Smokeless tobacco: Never Used  Substance and Sexual Activity  . Alcohol use: Yes    Comment: occasional  . Drug use: No  . Sexual activity: Not on file  Lifestyle  . Physical activity    Days per week: Not on file    Minutes per session: Not on file  . Stress: Not on file  Relationships  . Social Herbalist on phone: Not on file    Gets together: Not on file    Attends religious service: Not on file    Active member of club or organization: Not on file    Attends meetings of clubs or organizations: Not on file    Relationship status: Not on file  . Intimate partner violence    Fear of current or ex partner: Not on file    Emotionally abused: Not on file    Physically abused: Not on file    Forced sexual activity: Not on file  Other Topics Concern  . Not on file  Social History Narrative  . Not on file     Current Outpatient Medications on File Prior to Visit  Medication Sig Dispense Refill  . amLODipine (NORVASC) 10 MG  tablet Take 10 mg by mouth at bedtime.    Marland Kitchen aspirin EC 81 MG tablet Take 1 tablet (81 mg total) by mouth daily. 90 tablet 3  . bismuth subsalicylate (PEPTO-BISMOL) 262 MG chewable tablet Chew 2 tablets (524 mg total) by mouth 4 (four) times daily. (Patient not taking: Reported on 12/23/2018) 112 tablet 0  . diltiazem (TIAZAC) 240 MG 24 hr capsule Take 240 mg by mouth daily.    Marland Kitchen doxycycline (VIBRAMYCIN) 100 MG capsule Take 1 capsule (100 mg total) by mouth 2 (two) times daily. (Patient not taking: Reported on 12/23/2018) 28 capsule 0  . hydrochlorothiazide (HYDRODIURIL) 25 MG tablet Take 25 mg by mouth daily.    Marland Kitchen HYDROcodone-acetaminophen (NORCO) 5-325 MG tablet Take 1 tablet by mouth every 6 (six) hours as needed. (Patient not taking: Reported on 02/26/2018) 20 tablet 0  . isosorbide mononitrate (IMDUR) 30 MG 24 hr tablet Take 1 tablet (30 mg total) by mouth daily.  90 tablet 3  . lisinopril (PRINIVIL,ZESTRIL) 40 MG tablet Take 40 mg by mouth daily.    . metFORMIN (GLUCOPHAGE) 850 MG tablet Take 850 mg by mouth 2 (two) times daily with a meal.    . naproxen sodium (ALEVE) 220 MG tablet Take 220 mg by mouth 2 (two) times daily as needed (pain).    . nitroGLYCERIN (NITROSTAT) 0.4 MG SL tablet Place 1 tablet (0.4 mg total) under the tongue every 5 (five) minutes as needed for chest pain. 30 tablet 1  . omeprazole (PRILOSEC) 20 MG capsule Take 1 capsule (20 mg total) by mouth 2 (two) times daily. Take on an empty stomach 20 - 30 minutes prior to food. (Patient taking differently: Take 20 mg by mouth daily. Take on an empty stomach 20 - 30 minutes prior to food.) 180 capsule 3  . omeprazole (PRILOSEC) 20 MG capsule Take 20 mg by mouth once.    . pravastatin (PRAVACHOL) 20 MG tablet Take 20 mg by mouth daily.    . sertraline (ZOLOFT) 100 MG tablet Take 100 mg by mouth daily.    . sucralfate (CARAFATE) 1 GM/10ML suspension Take 10 mLs (1 g total) by mouth 4 (four) times daily. (Patient not taking: Reported on 02/26/2018) 420 mL 1   Current Facility-Administered Medications on File Prior to Visit  Medication Dose Route Frequency Provider Last Rate Last Dose  . 0.9 %  sodium chloride infusion  500 mL Intravenous Once Ladene Artist, MD        Cardiovascular studies:  EKG 08/04/2019: Sinus rhythm 82 bpm. Early R wave transition. Otherwise normal EKG.  Lexiscan myoview stress test 01/06/2019:  1. Lexiscan stress test with low level exercise was performed. Patient reached heart rate of 112 bpm which corresponds with 79% of the maximum predicted heart rate  No stress symptoms reported. Hemodynamic response was normal. In additions, the stress electrocardiogram showed sinus tachycardia, normal stress conduction, no stress arrhythmias and normal stress repolarization.   2. The overall quality of the study is good. There is no evidence of abnormal lung activity. Stress  and rest SPECT images demonstrate homogeneous tracer distribution throughout the myocardium. Gated SPECT imaging reveals normal myocardial thickening and wall motion. The left ventricular ejection fraction was normal (51%).   3. Low risk study.   Abdominal Aortic Duplex  01/24/2019 The maximum aorta diameter is 1.86 cm (dist). No evidence of significant atherosclerotic plaque. Normal aortic and iliac artery veloxity. Normal IVC.  Echocardiogram 01/24/2019 :  Left ventricle cavity is normal  in size. Moderate concentric hypertrophy of the left ventricle. Normal global wall motion. Normal diastolic filling pattern. Calculated EF 55%. Left atrial cavity is slightly dilated at 4.5 cm. Structurally normal tricuspid valve with trace regurgitation. The aortic root is mildly dilated at 3.9 cm.   Recent labs: Labs 12/11/2018: Glucose 97. BUN/Cr 22/0.8. eGFR 69/83.  Chol 140, TG 126, HDL 61, LDL 54.  Review of Systems  Constitution: Negative for decreased appetite, malaise/fatigue, weight gain and weight loss.  HENT: Negative for congestion.   Eyes: Negative for visual disturbance.  Cardiovascular: Negative for chest pain, dyspnea on exertion, leg swelling, palpitations and syncope.  Respiratory: Negative for shortness of breath.   Endocrine: Negative for cold intolerance.  Hematologic/Lymphatic: Does not bruise/bleed easily.  Skin: Negative for itching and rash.  Musculoskeletal: Negative for myalgias.  Gastrointestinal: Negative for abdominal pain, nausea and vomiting.  Genitourinary: Negative for dysuria.  Neurological: Negative for dizziness and weakness.  Psychiatric/Behavioral: The patient is not nervous/anxious.   All other systems reviewed and are negative.      Objective:    Vitals:   08/04/19 1039 08/04/19 1056  BP: (!) 151/75 140/72  Pulse: 71 89  Temp: 98.2 F (36.8 C)   SpO2: 95%      Physical Exam  Constitutional: He is oriented to person, place, and time. He  appears well-developed and well-nourished. No distress.  HENT:  Head: Normocephalic and atraumatic.  Eyes: Pupils are equal, round, and reactive to light. Conjunctivae are normal.  Neck: No JVD present.  Cardiovascular: Normal rate, regular rhythm and intact distal pulses.  No murmur heard. Pulmonary/Chest: Effort normal and breath sounds normal. He has no wheezes. He has no rales.  Abdominal: Soft. Bowel sounds are normal. There is no rebound.  Musculoskeletal:        General: No edema.  Lymphadenopathy:    He has no cervical adenopathy.  Neurological: He is alert and oriented to person, place, and time. No cranial nerve deficit.  Skin: Skin is warm and dry.  Psychiatric: He has a normal mood and affect.  Nursing note and vitals reviewed.       Assessment & Recommendations:    78 y/o Caucasian male with hypertension, type 2 DM, former smoker, seen for exertional chest pain, and shortness of breath.  1. Exertional chest pain/Exertional dyspnea: Submaximal stress test with no ischemia.  As such, stress test remains indeterminate.  Given his risk factors of hypertension, former smoker, and typical symptoms suggestive of angina, obstructive CAD remains in the differential.  He is on at least 3 antianginal agents.  Recommend left heart catheterization, coronary angiography and possible intervention.  2. Essential hypertension Blood pressure is well controlled.  He is on amlodipine and diltiazem, but tolerating it well without any significant side effects.  No change made to his medical regimen today.  [name], MD [facility] Pager: [phone] Office: [phone] If no answer Cell [phone]

## 2019-08-04 ENCOUNTER — Other Ambulatory Visit: Payer: Self-pay

## 2019-08-04 ENCOUNTER — Encounter: Payer: Self-pay | Admitting: Cardiology

## 2019-08-04 ENCOUNTER — Ambulatory Visit (INDEPENDENT_AMBULATORY_CARE_PROVIDER_SITE_OTHER): Payer: HMO | Admitting: Cardiology

## 2019-08-04 VITALS — BP 140/72 | HR 89 | Temp 98.2°F | Ht 66.0 in | Wt 206.4 lb

## 2019-08-04 DIAGNOSIS — R0609 Other forms of dyspnea: Secondary | ICD-10-CM

## 2019-08-04 DIAGNOSIS — I10 Essential (primary) hypertension: Secondary | ICD-10-CM | POA: Diagnosis not present

## 2019-08-04 DIAGNOSIS — E119 Type 2 diabetes mellitus without complications: Secondary | ICD-10-CM | POA: Diagnosis not present

## 2019-08-04 DIAGNOSIS — R079 Chest pain, unspecified: Secondary | ICD-10-CM

## 2019-08-04 DIAGNOSIS — R0600 Dyspnea, unspecified: Secondary | ICD-10-CM

## 2019-08-04 NOTE — Addendum Note (Signed)
Addended by: [name] on: 08/04/2019 11:43 AM   Modules accepted: Orders

## 2019-08-05 LAB — CBC
Hematocrit: 36.5 % — ABNORMAL LOW (ref 37.5–51.0)
Hemoglobin: 12.3 g/dL — ABNORMAL LOW (ref 13.0–17.7)
MCH: 27.5 pg (ref 26.6–33.0)
MCHC: 33.7 g/dL (ref 31.5–35.7)
MCV: 82 fL (ref 79–97)
Platelets: 201 10*3/uL (ref 150–450)
RBC: 4.47 x10E6/uL (ref 4.14–5.80)
RDW: 13.9 % (ref 11.6–15.4)
WBC: 9.3 10*3/uL (ref 3.4–10.8)

## 2019-08-05 LAB — BASIC METABOLIC PANEL
BUN/Creatinine Ratio: 17 (ref 10–24)
BUN: 21 mg/dL (ref 8–27)
CO2: 25 mmol/L (ref 20–29)
Calcium: 9.4 mg/dL (ref 8.6–10.2)
Chloride: 100 mmol/L (ref 96–106)
Creatinine, Ser: 1.21 mg/dL (ref 0.76–1.27)
GFR calc Af Amer: 66 mL/min/{1.73_m2} (ref >59)
GFR calc non Af Amer: 57 mL/min/{1.73_m2} — ABNORMAL LOW (ref >59)
Glucose: 104 mg/dL — ABNORMAL HIGH (ref 65–99)
Potassium: 3.5 mmol/L (ref 3.5–5.2)
Sodium: 144 mmol/L (ref 134–144)

## 2019-08-08 ENCOUNTER — Other Ambulatory Visit (HOSPITAL_COMMUNITY)
Admission: RE | Admit: 2019-08-08 | Discharge: 2019-08-08 | Disposition: A | Discharge status: Home / Self Care | Payer: HMO | Source: Ambulatory Visit | Attending: Cardiology | Admitting: Cardiology

## 2019-08-08 DIAGNOSIS — Z20828 Contact with and (suspected) exposure to other viral communicable diseases: Secondary | ICD-10-CM | POA: Diagnosis not present

## 2019-08-08 DIAGNOSIS — Z01812 Encounter for preprocedural laboratory examination: Secondary | ICD-10-CM | POA: Diagnosis not present

## 2019-08-09 LAB — NOVEL CORONAVIRUS, NAA (HOSP ORDER, SEND-OUT TO REF LAB; TAT 18-24 HRS): SARS-CoV-2, NAA: NOT DETECTED

## 2019-08-12 ENCOUNTER — Other Ambulatory Visit: Payer: Self-pay

## 2019-08-12 ENCOUNTER — Encounter (HOSPITAL_COMMUNITY)
Admission: AD | Disposition: A | Discharge status: Home / Self Care | Payer: Self-pay | Source: Home / Self Care | Attending: Cardiothoracic Surgery

## 2019-08-12 ENCOUNTER — Inpatient Hospital Stay (HOSPITAL_COMMUNITY)
Admission: AD | Admit: 2019-08-12 | Discharge: 2019-08-19 | DRG: 234 | Disposition: A | Discharge status: Home / Self Care | Payer: HMO | Attending: Cardiothoracic Surgery | Admitting: Cardiothoracic Surgery

## 2019-08-12 ENCOUNTER — Other Ambulatory Visit: Payer: Self-pay | Admitting: *Deleted

## 2019-08-12 DIAGNOSIS — I471 Supraventricular tachycardia: Secondary | ICD-10-CM | POA: Diagnosis not present

## 2019-08-12 DIAGNOSIS — Z87891 Personal history of nicotine dependence: Secondary | ICD-10-CM | POA: Diagnosis not present

## 2019-08-12 DIAGNOSIS — Z79891 Long term (current) use of opiate analgesic: Secondary | ICD-10-CM | POA: Diagnosis not present

## 2019-08-12 DIAGNOSIS — Z4682 Encounter for fitting and adjustment of non-vascular catheter: Secondary | ICD-10-CM | POA: Diagnosis not present

## 2019-08-12 DIAGNOSIS — E7800 Pure hypercholesterolemia, unspecified: Secondary | ICD-10-CM | POA: Diagnosis present

## 2019-08-12 DIAGNOSIS — I081 Rheumatic disorders of both mitral and tricuspid valves: Secondary | ICD-10-CM | POA: Diagnosis not present

## 2019-08-12 DIAGNOSIS — I10 Essential (primary) hypertension: Secondary | ICD-10-CM | POA: Diagnosis present

## 2019-08-12 DIAGNOSIS — Z7982 Long term (current) use of aspirin: Secondary | ICD-10-CM

## 2019-08-12 DIAGNOSIS — Z01810 Encounter for preprocedural cardiovascular examination: Secondary | ICD-10-CM | POA: Diagnosis not present

## 2019-08-12 DIAGNOSIS — R0609 Other forms of dyspnea: Secondary | ICD-10-CM | POA: Diagnosis present

## 2019-08-12 DIAGNOSIS — Z823 Family history of stroke: Secondary | ICD-10-CM | POA: Diagnosis not present

## 2019-08-12 DIAGNOSIS — E119 Type 2 diabetes mellitus without complications: Secondary | ICD-10-CM | POA: Diagnosis present

## 2019-08-12 DIAGNOSIS — K219 Gastro-esophageal reflux disease without esophagitis: Secondary | ICD-10-CM | POA: Diagnosis present

## 2019-08-12 DIAGNOSIS — I371 Nonrheumatic pulmonary valve insufficiency: Secondary | ICD-10-CM | POA: Diagnosis not present

## 2019-08-12 DIAGNOSIS — I2510 Atherosclerotic heart disease of native coronary artery without angina pectoris: Secondary | ICD-10-CM

## 2019-08-12 DIAGNOSIS — R079 Chest pain, unspecified: Secondary | ICD-10-CM | POA: Diagnosis present

## 2019-08-12 DIAGNOSIS — Z8719 Personal history of other diseases of the digestive system: Secondary | ICD-10-CM | POA: Diagnosis not present

## 2019-08-12 DIAGNOSIS — I973 Postprocedural hypertension: Secondary | ICD-10-CM | POA: Diagnosis not present

## 2019-08-12 DIAGNOSIS — R0789 Other chest pain: Secondary | ICD-10-CM | POA: Diagnosis not present

## 2019-08-12 DIAGNOSIS — J90 Pleural effusion, not elsewhere classified: Secondary | ICD-10-CM | POA: Diagnosis not present

## 2019-08-12 DIAGNOSIS — Z8249 Family history of ischemic heart disease and other diseases of the circulatory system: Secondary | ICD-10-CM

## 2019-08-12 DIAGNOSIS — I25110 Atherosclerotic heart disease of native coronary artery with unstable angina pectoris: Principal | ICD-10-CM | POA: Diagnosis present

## 2019-08-12 DIAGNOSIS — D62 Acute posthemorrhagic anemia: Secondary | ICD-10-CM | POA: Diagnosis not present

## 2019-08-12 DIAGNOSIS — Z951 Presence of aortocoronary bypass graft: Secondary | ICD-10-CM

## 2019-08-12 DIAGNOSIS — J9811 Atelectasis: Secondary | ICD-10-CM | POA: Diagnosis not present

## 2019-08-12 DIAGNOSIS — R0602 Shortness of breath: Secondary | ICD-10-CM | POA: Diagnosis not present

## 2019-08-12 DIAGNOSIS — Z09 Encounter for follow-up examination after completed treatment for conditions other than malignant neoplasm: Secondary | ICD-10-CM

## 2019-08-12 DIAGNOSIS — G4730 Sleep apnea, unspecified: Secondary | ICD-10-CM | POA: Diagnosis present

## 2019-08-12 DIAGNOSIS — Z7984 Long term (current) use of oral hypoglycemic drugs: Secondary | ICD-10-CM

## 2019-08-12 DIAGNOSIS — R0600 Dyspnea, unspecified: Secondary | ICD-10-CM | POA: Diagnosis present

## 2019-08-12 DIAGNOSIS — I25119 Atherosclerotic heart disease of native coronary artery with unspecified angina pectoris: Secondary | ICD-10-CM | POA: Diagnosis not present

## 2019-08-12 DIAGNOSIS — Z79899 Other long term (current) drug therapy: Secondary | ICD-10-CM | POA: Diagnosis not present

## 2019-08-12 DIAGNOSIS — Z9889 Other specified postprocedural states: Secondary | ICD-10-CM

## 2019-08-12 HISTORY — PX: LEFT HEART CATH AND CORONARY ANGIOGRAPHY: CATH118249

## 2019-08-12 LAB — GLUCOSE, CAPILLARY
Glucose-Capillary: 102 mg/dL — ABNORMAL HIGH (ref 70–99)
Glucose-Capillary: 86 mg/dL (ref 70–99)

## 2019-08-12 SURGERY — LEFT HEART CATH AND CORONARY ANGIOGRAPHY
Anesthesia: LOCAL

## 2019-08-12 MED ORDER — SODIUM CHLORIDE 0.9% FLUSH
3.0000 mL | Freq: Two times a day (BID) | INTRAVENOUS | Status: DC
  Administered 2019-08-13: 10:00:00 3 mL via INTRAVENOUS

## 2019-08-12 MED ORDER — LIDOCAINE HCL (PF) 1 % IJ SOLN
INTRAMUSCULAR | Status: DC | PRN
  Administered 2019-08-12: 2 mL

## 2019-08-12 MED ORDER — VERAPAMIL HCL 2.5 MG/ML IV SOLN
INTRAVENOUS | Status: DC | PRN
  Administered 2019-08-12: 10 mL via INTRA_ARTERIAL

## 2019-08-12 MED ORDER — HYDRALAZINE HCL 20 MG/ML IJ SOLN: 10.0000 mg | INTRAMUSCULAR | Status: AC | PRN

## 2019-08-12 MED ORDER — IOHEXOL 350 MG/ML SOLN
INTRAVENOUS | Status: DC | PRN
  Administered 2019-08-12: 110 mL

## 2019-08-12 MED ORDER — SODIUM CHLORIDE 0.9% FLUSH: 3.0000 mL | INTRAVENOUS | Status: DC | PRN

## 2019-08-12 MED ORDER — SERTRALINE HCL 100 MG PO TABS
100.0000 mg | ORAL_TABLET | Freq: Every day | ORAL | Status: DC
  Administered 2019-08-13 – 2019-08-19 (×6): 100 mg via ORAL
  Filled 2019-08-12 (×6): qty 1

## 2019-08-12 MED ORDER — MIDAZOLAM HCL 2 MG/2ML IJ SOLN
INTRAMUSCULAR | Status: DC | PRN
  Administered 2019-08-12: 1 mg via INTRAVENOUS

## 2019-08-12 MED ORDER — AMLODIPINE BESYLATE 10 MG PO TABS
10.0000 mg | ORAL_TABLET | Freq: Every day | ORAL | Status: DC
  Administered 2019-08-12 – 2019-08-13 (×2): 10 mg via ORAL
  Filled 2019-08-12: qty 1

## 2019-08-12 MED ORDER — VERAPAMIL HCL 2.5 MG/ML IV SOLN
INTRAVENOUS | Status: AC
  Filled 2019-08-12: qty 2

## 2019-08-12 MED ORDER — HEPARIN (PORCINE) IN NACL 1000-0.9 UT/500ML-% IV SOLN
INTRAVENOUS | Status: AC
  Filled 2019-08-12: qty 1000

## 2019-08-12 MED ORDER — METOPROLOL SUCCINATE ER 50 MG PO TB24
50.0000 mg | ORAL_TABLET | Freq: Every day | ORAL | Status: DC
  Administered 2019-08-13: 10:00:00 50 mg via ORAL
  Filled 2019-08-12: qty 1

## 2019-08-12 MED ORDER — ASPIRIN 81 MG PO CHEW
CHEWABLE_TABLET | ORAL | Status: AC
  Filled 2019-08-12: qty 1

## 2019-08-12 MED ORDER — HYDROCHLOROTHIAZIDE 25 MG PO TABS
25.0000 mg | ORAL_TABLET | Freq: Two times a day (BID) | ORAL | Status: DC
  Administered 2019-08-12 – 2019-08-13 (×3): 25 mg via ORAL
  Filled 2019-08-12 (×2): qty 1

## 2019-08-12 MED ORDER — HEPARIN (PORCINE) IN NACL 1000-0.9 UT/500ML-% IV SOLN
INTRAVENOUS | Status: DC | PRN
  Administered 2019-08-12 (×2): 500 mL

## 2019-08-12 MED ORDER — FENTANYL CITRATE (PF) 100 MCG/2ML IJ SOLN
INTRAMUSCULAR | Status: DC | PRN
  Administered 2019-08-12: 25 ug via INTRAVENOUS

## 2019-08-12 MED ORDER — MIDAZOLAM HCL 2 MG/2ML IJ SOLN
INTRAMUSCULAR | Status: AC
  Filled 2019-08-12: qty 2

## 2019-08-12 MED ORDER — SODIUM CHLORIDE 0.9 % IV SOLN: 250.0000 mL | INTRAVENOUS | Status: DC | PRN

## 2019-08-12 MED ORDER — IRBESARTAN 150 MG PO TABS
150.0000 mg | ORAL_TABLET | Freq: Every day | ORAL | Status: DC
  Administered 2019-08-13: 150 mg via ORAL
  Filled 2019-08-12: qty 1

## 2019-08-12 MED ORDER — ACETAMINOPHEN 325 MG PO TABS: 650.0000 mg | ORAL_TABLET | ORAL | Status: DC | PRN

## 2019-08-12 MED ORDER — SODIUM CHLORIDE 0.9 % WEIGHT BASED INFUSION
3.0000 mL/kg/h | INTRAVENOUS | Status: AC
  Administered 2019-08-12: 13:00:00 3 mL/kg/h via INTRAVENOUS

## 2019-08-12 MED ORDER — PANTOPRAZOLE SODIUM 40 MG PO TBEC
40.0000 mg | DELAYED_RELEASE_TABLET | Freq: Every day | ORAL | Status: DC
  Administered 2019-08-13: 40 mg via ORAL
  Filled 2019-08-12: qty 1

## 2019-08-12 MED ORDER — HEPARIN SODIUM (PORCINE) 1000 UNIT/ML IJ SOLN
INTRAMUSCULAR | Status: AC
  Filled 2019-08-12: qty 1

## 2019-08-12 MED ORDER — SODIUM CHLORIDE 0.9 % IV SOLN
INTRAVENOUS | Status: AC
  Administered 2019-08-12: 21:00:00 via INTRAVENOUS

## 2019-08-12 MED ORDER — LABETALOL HCL 5 MG/ML IV SOLN: 10.0000 mg | INTRAVENOUS | Status: AC | PRN

## 2019-08-12 MED ORDER — PRAVASTATIN SODIUM 40 MG PO TABS: 40.0000 mg | ORAL_TABLET | Freq: Every day | ORAL | Status: DC

## 2019-08-12 MED ORDER — HEPARIN SODIUM (PORCINE) 1000 UNIT/ML IJ SOLN
INTRAMUSCULAR | Status: DC | PRN
  Administered 2019-08-12: 5000 [IU] via INTRAVENOUS

## 2019-08-12 MED ORDER — ASPIRIN EC 81 MG PO TBEC
81.0000 mg | DELAYED_RELEASE_TABLET | Freq: Every day | ORAL | Status: DC
  Administered 2019-08-13: 81 mg via ORAL
  Filled 2019-08-12: qty 1

## 2019-08-12 MED ORDER — SODIUM CHLORIDE 0.9% FLUSH
3.0000 mL | Freq: Two times a day (BID) | INTRAVENOUS | Status: DC
  Administered 2019-08-13 (×2): 3 mL via INTRAVENOUS

## 2019-08-12 MED ORDER — ASPIRIN 81 MG PO CHEW
81.0000 mg | CHEWABLE_TABLET | ORAL | Status: AC
  Administered 2019-08-12: 13:00:00 81 mg via ORAL

## 2019-08-12 MED ORDER — FENTANYL CITRATE (PF) 100 MCG/2ML IJ SOLN
INTRAMUSCULAR | Status: AC
  Filled 2019-08-12: qty 2

## 2019-08-12 MED ORDER — LIDOCAINE HCL (PF) 1 % IJ SOLN
INTRAMUSCULAR | Status: AC
  Filled 2019-08-12: qty 30

## 2019-08-12 MED ORDER — ONDANSETRON HCL 4 MG/2ML IJ SOLN: 4.0000 mg | Freq: Four times a day (QID) | INTRAMUSCULAR | Status: DC | PRN

## 2019-08-12 MED ORDER — SODIUM CHLORIDE 0.9 % WEIGHT BASED INFUSION: 1.0000 mL/kg/h | INTRAVENOUS | Status: DC

## 2019-08-12 SURGICAL SUPPLY — 16 items
CATH INFINITI 5FR AL1 (CATHETERS) ×2 IMPLANT
CATH INFINITI 5FR JL4 (CATHETERS) ×2 IMPLANT
CATH LAUNCHER 5F EBU3.0 (CATHETERS) ×1 IMPLANT
CATH LAUNCHER 5F JL3 (CATHETERS) ×1 IMPLANT
CATH OPTITORQUE TIG 4.0 5F (CATHETERS) ×2 IMPLANT
CATHETER LAUNCHER 5F EBU3.0 (CATHETERS) ×2
CATHETER LAUNCHER 5F JL3 (CATHETERS) ×2
DEVICE RAD COMP TR BAND LRG (VASCULAR PRODUCTS) ×2 IMPLANT
GLIDESHEATH SLEND A-KIT 6F 22G (SHEATH) ×2 IMPLANT
GUIDEWIRE INQWIRE 1.5J.035X260 (WIRE) ×1 IMPLANT
INQWIRE 1.5J .035X260CM (WIRE) ×2
KIT HEART LEFT (KITS) ×2 IMPLANT
PACK CARDIAC CATHETERIZATION (CUSTOM PROCEDURE TRAY) ×2 IMPLANT
SYR MEDRAD MARK 7 150ML (SYRINGE) ×2 IMPLANT
TRANSDUCER W/STOPCOCK (MISCELLANEOUS) ×2 IMPLANT
TUBING CIL FLEX 10 FLL-RA (TUBING) ×2 IMPLANT

## 2019-08-12 NOTE — H&P (Signed)
OV 9/14     Patient is here for follow up visit.  Subjective:   Timothy [name], male    DOB: Mar 28, 1941, 78 y.o.   MRN: [id number]   No chief complaint on file.   HPI  78 y/o Caucasian male with hypertension, type 2 DM, former smoker, seen for exertional chest pain, and shortness of breath.  Patient's echocardiogram did not show any significant abnormalities to explain his symptoms.  On exercise nuclear stress test, there was no evidence of ischemia.  However, patient only achieved 79% of his age-predicted maximum heart rate.  As result, stress test was submaximal.  Patient is here for 6 month follow up.  He continues to have symptoms of exertional dyspnea and chest tightness that predictably improves with rest.  He is on good medical therapy with reasonably well-controlled blood pressure.  Past Medical History:  Diagnosis Date  . Diabetes mellitus without complication ([place])   . Elevated cholesterol   . GERD (gastroesophageal reflux disease)   . Hyperplastic colonic polyp    2010  . Hypertension   . Sleep apnea    no cpap  . Tubular adenoma of colon    2010     Past Surgical History:  Procedure Laterality Date  . CARPAL TUNNEL RELEASE Right 09/11/2017   Procedure: RIGHT CARPAL TUNNEL RELEASE;  Surgeon: Daryll Brod, MD;  Location: [facility];  Service: Orthopedics;  Laterality: Right;  . ROTATOR CUFF REPAIR       Social History   Socioeconomic History  . Marital status: Widowed    Spouse name: Not on file  . Number of children: 2  . Years of education: Not on file  . Highest education level: Not on file  Occupational History  . Occupation: Retired  Scientific laboratory technician  . Financial resource strain: Not on file  . Food insecurity    Worry: Never true    Inability: Never true  . Transportation needs    Medical: No    Non-medical: No  Tobacco Use  . Smoking status: Former Smoker    Packs/day: 1.00    Types: Cigarettes    Quit date: 1975   Years since quitting: 45.7  . Smokeless tobacco: Never Used  Substance and Sexual Activity  . Alcohol use: Yes    Comment: occasional  . Drug use: No  . Sexual activity: Not on file  Lifestyle  . Physical activity    Days per week: Not on file    Minutes per session: Not on file  . Stress: Not on file  Relationships  . Social Herbalist on phone: Not on file    Gets together: Not on file    Attends religious service: Not on file    Active member of club or organization: Not on file    Attends meetings of clubs or organizations: Not on file    Relationship status: Not on file  . Intimate partner violence    Fear of current or ex partner: Not on file    Emotionally abused: Not on file    Physically abused: Not on file    Forced sexual activity: Not on file  Other Topics Concern  . Not on file  Social History Narrative  . Not on file     No current facility-administered medications on file prior to encounter.    Current Outpatient Medications on File Prior to Encounter  Medication Sig Dispense Refill  . amLODipine (NORVASC) 10 MG tablet  Take 10 mg by mouth at bedtime.    Marland Kitchen aspirin EC 81 MG tablet Take 1 tablet (81 mg total) by mouth daily. 90 tablet 3  . diltiazem (TIAZAC) 360 MG 24 hr capsule Take 360 mg by mouth daily.     . hydrochlorothiazide (HYDRODIURIL) 25 MG tablet Take 25 mg by mouth 2 (two) times daily.     . metFORMIN (GLUCOPHAGE) 850 MG tablet Take 850 mg by mouth 2 (two) times daily with a meal.    . nitroGLYCERIN (NITROSTAT) 0.4 MG SL tablet Place 1 tablet (0.4 mg total) under the tongue every 5 (five) minutes as needed for chest pain. 30 tablet 1  . olmesartan (BENICAR) 40 MG tablet Take 40 mg by mouth daily.    Marland Kitchen omeprazole (PRILOSEC) 20 MG capsule Take 1 capsule (20 mg total) by mouth 2 (two) times daily. Take on an empty stomach 20 - 30 minutes prior to food. (Patient taking differently: Take 20 mg by mouth daily. Take on an empty stomach 20 - 30  minutes prior to food.) 180 capsule 3  . pravastatin (PRAVACHOL) 40 MG tablet Take 40 mg by mouth daily.     . sertraline (ZOLOFT) 100 MG tablet Take 100 mg by mouth daily.    Marland Kitchen bismuth subsalicylate (PEPTO-BISMOL) 262 MG chewable tablet Chew 2 tablets (524 mg total) by mouth 4 (four) times daily. (Patient not taking: Reported on 08/08/2019) 112 tablet 0  . HYDROcodone-acetaminophen (NORCO) 5-325 MG tablet Take 1 tablet by mouth every 6 (six) hours as needed. (Patient not taking: Reported on 08/08/2019) 20 tablet 0  . isosorbide mononitrate (IMDUR) 30 MG 24 hr tablet Take 1 tablet (30 mg total) by mouth daily. (Patient not taking: Reported on 08/08/2019) 90 tablet 3  . sucralfate (CARAFATE) 1 GM/10ML suspension Take 10 mLs (1 g total) by mouth 4 (four) times daily. (Patient not taking: Reported on 08/08/2019) 420 mL 1    Cardiovascular studies:  EKG 08/04/2019: Sinus rhythm 82 bpm. Early R wave transition. Otherwise normal EKG.  Lexiscan myoview stress test 01/06/2019:  1. Lexiscan stress test with low level exercise was performed. Patient reached heart rate of 112 bpm which corresponds with 79% of the maximum predicted heart rate  No stress symptoms reported. Hemodynamic response was normal. In additions, the stress electrocardiogram showed sinus tachycardia, normal stress conduction, no stress arrhythmias and normal stress repolarization.   2. The overall quality of the study is good. There is no evidence of abnormal lung activity. Stress and rest SPECT images demonstrate homogeneous tracer distribution throughout the myocardium. Gated SPECT imaging reveals normal myocardial thickening and wall motion. The left ventricular ejection fraction was normal (51%).   3. Low risk study.   Abdominal Aortic Duplex  01/24/2019 The maximum aorta diameter is 1.86 cm (dist). No evidence of significant atherosclerotic plaque. Normal aortic and iliac artery veloxity. Normal IVC.  Echocardiogram 01/24/2019 :   Left ventricle cavity is normal in size. Moderate concentric hypertrophy of the left ventricle. Normal global wall motion. Normal diastolic filling pattern. Calculated EF 55%. Left atrial cavity is slightly dilated at 4.5 cm. Structurally normal tricuspid valve with trace regurgitation. The aortic root is mildly dilated at 3.9 cm.   Recent labs: Labs 12/11/2018: Glucose 97. BUN/Cr 22/0.8. eGFR 69/83.  Chol 140, TG 126, HDL 61, LDL 54.  Review of Systems  Constitution: Negative for decreased appetite, malaise/fatigue, weight gain and weight loss.  HENT: Negative for congestion.   Eyes: Negative for visual disturbance.  Cardiovascular: Negative for chest pain, dyspnea on exertion, leg swelling, palpitations and syncope.  Respiratory: Negative for shortness of breath.   Endocrine: Negative for cold intolerance.  Hematologic/Lymphatic: Does not bruise/bleed easily.  Skin: Negative for itching and rash.  Musculoskeletal: Negative for myalgias.  Gastrointestinal: Negative for abdominal pain, nausea and vomiting.  Genitourinary: Negative for dysuria.  Neurological: Negative for dizziness and weakness.  Psychiatric/Behavioral: The patient is not nervous/anxious.   All other systems reviewed and are negative.      Objective:    Vitals:   08/12/19 1241  BP: 121/78  Pulse: 91  Temp: 98.1 F (36.7 C)  SpO2: 99%     Physical Exam  Constitutional: He is oriented to person, place, and time. He appears well-developed and well-nourished. No distress.  HENT:  Head: Normocephalic and atraumatic.  Eyes: Pupils are equal, round, and reactive to light. Conjunctivae are normal.  Neck: No JVD present.  Cardiovascular: Normal rate, regular rhythm and intact distal pulses.  No murmur heard. Pulmonary/Chest: Effort normal and breath sounds normal. He has no wheezes. He has no rales.  Abdominal: Soft. Bowel sounds are normal. There is no rebound.  Musculoskeletal:        General: No edema.   Lymphadenopathy:    He has no cervical adenopathy.  Neurological: He is alert and oriented to person, place, and time. No cranial nerve deficit.  Skin: Skin is warm and dry.  Psychiatric: He has a normal mood and affect.  Nursing note and vitals reviewed.       Assessment & Recommendations:    78 y/o Caucasian male with hypertension, type 2 DM, former smoker, seen for exertional chest pain, and shortness of breath.  1. Exertional chest pain/Exertional dyspnea: Submaximal stress test with no ischemia.  As such, stress test remains indeterminate.  Given his risk factors of hypertension, former smoker, and typical symptoms suggestive of angina, obstructive CAD remains in the differential.  He is on at least 3 antianginal agents.  Recommend left heart catheterization, coronary angiography and possible intervention.  2. Essential hypertension Blood pressure is well controlled.  He is on amlodipine and diltiazem, but tolerating it well without any significant side effects.  No change made to his medical regimen today.  [name], MD [facility] [place] Pager: [phone] Office: [phone] If no answer Cell [phone]

## 2019-08-12 NOTE — Interval H&P Note (Signed)
History and Physical Interval Note:  08/12/2019 4:56 PM  Timothy [name]  has presented today for surgery, with the diagnosis of Chest pain.  The various methods of treatment have been discussed with the patient and family. After consideration of risks, benefits and other options for treatment, the patient has consented to  Procedure(s): LEFT HEART CATH AND CORONARY ANGIOGRAPHY (N/A) as a surgical intervention.  The patient's history has been reviewed, patient examined, no change in status, stable for surgery.  I have reviewed the patient's chart and labs.  Questions were answered to the patient's satisfaction.    2016/2017 Appropriate Use Criteria for Coronary Revascularization Clinical Presentation: Diabetes Mellitus? Symptom Status? S/P CABG? Antianginal Therapy (# of long-acting drugs)? Results of Non-invasive testing? FFR/iFR results in all diseased vessels? Patient undergoing renal transplant? Patient undergoing percutaneous valve procedure (TAVR, MitraClip, Others)? Symptom Status:  Ischemic Symptoms  Non-invasive Testing:  Indeterminate  If no or indeterminate stress test, FFR/iFR results in all diseased vessels:  Not done  Diabetes Mellitus:  Yes  S/P CABG:  No  Antianginal therapy (number of long-acting drugs):  >=2  Patient undergoing renal transplant:  No  Patient undergoing percutaneous valve procedure:  No    newline 1 Vessel Disease PCI CABG  No proximal LAD involvement, No proximal left dominant LCX involvement Not rated Not rated   Proximal left dominant LCX involvement Not rated Not rated   Proximal LAD involvement Not rated Not rated   newline 2 Vessel Disease  No proximal LAD involvement Not rated Not rated   Proximal LAD involvement Not rated Not rated   newline 3 Vessel Disease  Low disease complexity (e.g., focal stenoses, SYNTAX <=22) Not rated Not rated   Intermediate or high disease complexity (e.g., SYNTAX >=23) Not rated Not rated   newline Left Main  Disease  Isolated LMCA disease: ostial or midshaft A (7); Indication 24 A (9); Indication 24   Isolated LMCA disease: bifurcation involvement M (6); Indication 25 A (9); Indication 25   LMCA ostial or midshaft, concurrent low disease burden multivessel disease (e.g., 1-2 additional focal stenoses, SYNTAX <=22) A (7); Indication 26 A (9); Indication 26   LMCA ostial or midshaft, concurrent intermediate or high disease burden multivessel disease (e.g., 1-2 additional bifurcation stenoses, long stenoses, SYNTAX >=23) M (4); Indication 27 A (9); Indication 27   LMCA bifurcation involvement, concurrent low disease burden multivessel disease (e.g., 1-2 additional focal stenoses, SYNTAX <=22) M (6); Indication 28 A (9); Indication 28   LMCA bifurcation involvement, concurrent intermediate or high disease burden multivessel disease (e.g., 1-2 additional bifurcation stenoses, long stenoses, SYNTAX >=23) R (3); Indication 29 A (9); Indication [place]

## 2019-08-13 ENCOUNTER — Encounter (HOSPITAL_COMMUNITY): Payer: Self-pay | Admitting: Cardiology

## 2019-08-13 ENCOUNTER — Ambulatory Visit (HOSPITAL_COMMUNITY): Payer: HMO

## 2019-08-13 ENCOUNTER — Other Ambulatory Visit: Payer: Self-pay

## 2019-08-13 ENCOUNTER — Ambulatory Visit (HOSPITAL_BASED_OUTPATIENT_CLINIC_OR_DEPARTMENT_OTHER): Payer: HMO

## 2019-08-13 DIAGNOSIS — I10 Essential (primary) hypertension: Secondary | ICD-10-CM | POA: Diagnosis not present

## 2019-08-13 DIAGNOSIS — R079 Chest pain, unspecified: Secondary | ICD-10-CM | POA: Diagnosis present

## 2019-08-13 DIAGNOSIS — K219 Gastro-esophageal reflux disease without esophagitis: Secondary | ICD-10-CM | POA: Diagnosis not present

## 2019-08-13 DIAGNOSIS — Z823 Family history of stroke: Secondary | ICD-10-CM | POA: Diagnosis not present

## 2019-08-13 DIAGNOSIS — I973 Postprocedural hypertension: Secondary | ICD-10-CM | POA: Diagnosis not present

## 2019-08-13 DIAGNOSIS — D62 Acute posthemorrhagic anemia: Secondary | ICD-10-CM | POA: Diagnosis not present

## 2019-08-13 DIAGNOSIS — Z87891 Personal history of nicotine dependence: Secondary | ICD-10-CM | POA: Diagnosis not present

## 2019-08-13 DIAGNOSIS — Z8719 Personal history of other diseases of the digestive system: Secondary | ICD-10-CM | POA: Diagnosis not present

## 2019-08-13 DIAGNOSIS — Z8249 Family history of ischemic heart disease and other diseases of the circulatory system: Secondary | ICD-10-CM | POA: Diagnosis not present

## 2019-08-13 DIAGNOSIS — Z79899 Other long term (current) drug therapy: Secondary | ICD-10-CM | POA: Diagnosis not present

## 2019-08-13 DIAGNOSIS — I2510 Atherosclerotic heart disease of native coronary artery without angina pectoris: Secondary | ICD-10-CM

## 2019-08-13 DIAGNOSIS — Z79891 Long term (current) use of opiate analgesic: Secondary | ICD-10-CM | POA: Diagnosis not present

## 2019-08-13 DIAGNOSIS — I471 Supraventricular tachycardia: Secondary | ICD-10-CM | POA: Diagnosis not present

## 2019-08-13 DIAGNOSIS — Z01810 Encounter for preprocedural cardiovascular examination: Secondary | ICD-10-CM | POA: Diagnosis not present

## 2019-08-13 DIAGNOSIS — Z7984 Long term (current) use of oral hypoglycemic drugs: Secondary | ICD-10-CM | POA: Diagnosis not present

## 2019-08-13 DIAGNOSIS — Z7982 Long term (current) use of aspirin: Secondary | ICD-10-CM | POA: Diagnosis not present

## 2019-08-13 DIAGNOSIS — R0602 Shortness of breath: Secondary | ICD-10-CM | POA: Diagnosis not present

## 2019-08-13 DIAGNOSIS — I25110 Atherosclerotic heart disease of native coronary artery with unstable angina pectoris: Secondary | ICD-10-CM | POA: Diagnosis present

## 2019-08-13 DIAGNOSIS — E119 Type 2 diabetes mellitus without complications: Secondary | ICD-10-CM | POA: Diagnosis not present

## 2019-08-13 DIAGNOSIS — E7800 Pure hypercholesterolemia, unspecified: Secondary | ICD-10-CM | POA: Diagnosis not present

## 2019-08-13 DIAGNOSIS — G4730 Sleep apnea, unspecified: Secondary | ICD-10-CM | POA: Diagnosis not present

## 2019-08-13 LAB — BLOOD GAS, ARTERIAL
Acid-Base Excess: 0.5 mmol/L (ref 0.0–2.0)
Bicarbonate: 24.7 mmol/L (ref 20.0–28.0)
Drawn by: 129711
FIO2: 21
O2 Saturation: 95.2 %
Patient temperature: 98.6
pCO2 arterial: 40.7 mmHg (ref 32.0–48.0)
pH, Arterial: 7.401 (ref 7.350–7.450)
pO2, Arterial: 76.1 mmHg — ABNORMAL LOW (ref 83.0–108.0)

## 2019-08-13 LAB — TYPE AND SCREEN
ABO/RH(D): A POS
Antibody Screen: NEGATIVE

## 2019-08-13 LAB — URINALYSIS, ROUTINE W REFLEX MICROSCOPIC
Bilirubin Urine: NEGATIVE
Glucose, UA: NEGATIVE mg/dL
Hgb urine dipstick: NEGATIVE
Ketones, ur: NEGATIVE mg/dL
Leukocytes,Ua: NEGATIVE
Nitrite: NEGATIVE
Protein, ur: NEGATIVE mg/dL
Specific Gravity, Urine: 1.016 (ref 1.005–1.030)
pH: 6 (ref 5.0–8.0)

## 2019-08-13 LAB — BASIC METABOLIC PANEL
Anion gap: 9 (ref 5–15)
BUN: 15 mg/dL (ref 8–23)
CO2: 26 mmol/L (ref 22–32)
Calcium: 8.8 mg/dL — ABNORMAL LOW (ref 8.9–10.3)
Chloride: 104 mmol/L (ref 98–111)
Creatinine, Ser: 1.09 mg/dL (ref 0.61–1.24)
GFR calc Af Amer: >60 mL/min (ref >60)
GFR calc non Af Amer: >60 mL/min (ref >60)
Glucose, Bld: 174 mg/dL — ABNORMAL HIGH (ref 70–99)
Potassium: 3.7 mmol/L (ref 3.5–5.1)
Sodium: 139 mmol/L (ref 135–145)

## 2019-08-13 LAB — PROTIME-INR
INR: 1 (ref 0.8–1.2)
Prothrombin Time: 13.4 seconds (ref 11.4–15.2)

## 2019-08-13 LAB — ECHOCARDIOGRAM COMPLETE
Height: 66 in
Weight: 3202.84 oz

## 2019-08-13 LAB — ABO/RH: ABO/RH(D): A POS

## 2019-08-13 LAB — SURGICAL PCR SCREEN
MRSA, PCR: NEGATIVE
Staphylococcus aureus: NEGATIVE

## 2019-08-13 LAB — HEMOGLOBIN A1C
Hgb A1c MFr Bld: 6.4 % — ABNORMAL HIGH (ref 4.8–5.6)
Mean Plasma Glucose: 136.98 mg/dL

## 2019-08-13 LAB — APTT: aPTT: 30 seconds (ref 24–36)

## 2019-08-13 MED ORDER — CHLORHEXIDINE GLUCONATE 0.12 % MT SOLN
15.0000 mL | Freq: Once | OROMUCOSAL | Status: AC
  Administered 2019-08-14: 15 mL via OROMUCOSAL
  Filled 2019-08-13: qty 15

## 2019-08-13 MED ORDER — SODIUM CHLORIDE 0.9 % IV SOLN
750.0000 mg | INTRAVENOUS | Status: DC
  Filled 2019-08-13: qty 750

## 2019-08-13 MED ORDER — DEXMEDETOMIDINE HCL IN NACL 400 MCG/100ML IV SOLN
0.1000 ug/kg/h | INTRAVENOUS | Status: AC
  Administered 2019-08-14: 08:00:00 .3 ug/kg/h via INTRAVENOUS
  Filled 2019-08-13: qty 100

## 2019-08-13 MED ORDER — ROSUVASTATIN CALCIUM 20 MG PO TABS: 10.0000 mg | ORAL_TABLET | Freq: Every day | ORAL | 2 refills | Status: DC

## 2019-08-13 MED ORDER — MAGNESIUM SULFATE 50 % IJ SOLN
40.0000 meq | INTRAMUSCULAR | Status: DC
  Filled 2019-08-13: qty 9.85

## 2019-08-13 MED ORDER — CHLORHEXIDINE GLUCONATE CLOTH 2 % EX PADS
6.0000 | MEDICATED_PAD | Freq: Once | CUTANEOUS | Status: AC
  Administered 2019-08-14: 05:00:00 6 via TOPICAL

## 2019-08-13 MED ORDER — TEMAZEPAM 15 MG PO CAPS: 15.0000 mg | ORAL_CAPSULE | Freq: Once | ORAL | Status: DC | PRN

## 2019-08-13 MED ORDER — VANCOMYCIN HCL 1000 MG IV SOLR
INTRAVENOUS | Status: DC
  Filled 2019-08-13: qty 1000

## 2019-08-13 MED ORDER — NITROGLYCERIN IN D5W 200-5 MCG/ML-% IV SOLN
2.0000 ug/min | INTRAVENOUS | Status: DC
  Filled 2019-08-13: qty 250

## 2019-08-13 MED ORDER — MILRINONE LACTATE IN DEXTROSE 20-5 MG/100ML-% IV SOLN
0.3000 ug/kg/min | INTRAVENOUS | Status: DC
  Filled 2019-08-13: qty 100

## 2019-08-13 MED ORDER — PHENYLEPHRINE HCL-NACL 20-0.9 MG/250ML-% IV SOLN
30.0000 ug/min | INTRAVENOUS | Status: AC
  Administered 2019-08-14: 15 ug/min via INTRAVENOUS
  Filled 2019-08-13: qty 250

## 2019-08-13 MED ORDER — TRANEXAMIC ACID (OHS) BOLUS VIA INFUSION
15.0000 mg/kg | INTRAVENOUS | Status: AC
  Administered 2019-08-14: 1362 mg via INTRAVENOUS
  Filled 2019-08-13: qty 1362

## 2019-08-13 MED ORDER — SODIUM CHLORIDE 0.9 % IV SOLN
1.5000 g | INTRAVENOUS | Status: AC
  Administered 2019-08-14: 08:00:00 1.5 g via INTRAVENOUS
  Filled 2019-08-13: qty 1.5

## 2019-08-13 MED ORDER — METOPROLOL TARTRATE 12.5 MG HALF TABLET
12.5000 mg | ORAL_TABLET | Freq: Once | ORAL | Status: AC
  Administered 2019-08-14: 12.5 mg via ORAL
  Filled 2019-08-13: qty 1

## 2019-08-13 MED ORDER — POTASSIUM CHLORIDE 2 MEQ/ML IV SOLN
80.0000 meq | INTRAVENOUS | Status: DC
  Filled 2019-08-13: qty 40

## 2019-08-13 MED ORDER — BISACODYL 5 MG PO TBEC
5.0000 mg | DELAYED_RELEASE_TABLET | Freq: Once | ORAL | Status: AC
  Administered 2019-08-13: 5 mg via ORAL
  Filled 2019-08-13: qty 1

## 2019-08-13 MED ORDER — INSULIN REGULAR(HUMAN) IN NACL 100-0.9 UT/100ML-% IV SOLN
INTRAVENOUS | Status: AC
  Administered 2019-08-14: 08:00:00 1 [IU]/h via INTRAVENOUS
  Filled 2019-08-13: qty 100

## 2019-08-13 MED ORDER — ROSUVASTATIN CALCIUM 5 MG PO TABS
10.0000 mg | ORAL_TABLET | Freq: Every day | ORAL | Status: DC
  Administered 2019-08-13 – 2019-08-18 (×6): 10 mg via ORAL
  Filled 2019-08-13 (×6): qty 2

## 2019-08-13 MED ORDER — EPINEPHRINE HCL 5 MG/250ML IV SOLN IN NS
0.0000 ug/min | INTRAVENOUS | Status: DC
  Filled 2019-08-13: qty 250

## 2019-08-13 MED ORDER — PLASMA-LYTE 148 IV SOLN
INTRAVENOUS | Status: DC
  Filled 2019-08-13: qty 2.5

## 2019-08-13 MED ORDER — TRANEXAMIC ACID 1000 MG/10ML IV SOLN
1.5000 mg/kg/h | INTRAVENOUS | Status: AC
  Administered 2019-08-14: 09:00:00 1.5 mg/kg/h via INTRAVENOUS
  Filled 2019-08-13: qty 25

## 2019-08-13 MED ORDER — TRANEXAMIC ACID (OHS) PUMP PRIME SOLUTION
2.0000 mg/kg | INTRAVENOUS | Status: DC
  Filled 2019-08-13: qty 1.82

## 2019-08-13 MED ORDER — CHLORHEXIDINE GLUCONATE CLOTH 2 % EX PADS
6.0000 | MEDICATED_PAD | Freq: Once | CUTANEOUS | Status: AC
  Administered 2019-08-13: 6 via TOPICAL

## 2019-08-13 MED ORDER — SODIUM CHLORIDE 0.9 % IV SOLN
INTRAVENOUS | Status: DC
  Filled 2019-08-13: qty 30

## 2019-08-13 MED ORDER — METOPROLOL SUCCINATE ER 50 MG PO TB24: 50.0000 mg | ORAL_TABLET | Freq: Every day | ORAL | 2 refills | Status: DC

## 2019-08-13 MED ORDER — VANCOMYCIN HCL 10 G IV SOLR
1500.0000 mg | INTRAVENOUS | Status: AC
  Administered 2019-08-14: 08:00:00 1500 mg via INTRAVENOUS
  Filled 2019-08-13: qty 1500

## 2019-08-13 MED ORDER — HYDROCHLOROTHIAZIDE 25 MG PO TABS: 25.0000 mg | ORAL_TABLET | Freq: Every day | ORAL | 0 refills | Status: DC

## 2019-08-13 MED ORDER — DOPAMINE-DEXTROSE 3.2-5 MG/ML-% IV SOLN
0.0000 ug/kg/min | INTRAVENOUS | Status: DC
  Filled 2019-08-13: qty 250

## 2019-08-13 MED FILL — METOPROLOL SUCCINATE ER 50: 50 | 30 days supply | Qty: 30 | Fill #0

## 2019-08-13 MED FILL — ROSUVASTATIN CALCIUM 20 MG: 20 | 60 days supply | Qty: 30 | Fill #0

## 2019-08-13 NOTE — Progress Notes (Signed)
Incentive Spirometer given to patient. Education give on purpose and how to use. No questions at this time.

## 2019-08-13 NOTE — Anesthesia Preprocedure Evaluation (Addendum)
Anesthesia Evaluation  Patient identified by MRN, date of birth, ID band Patient awake    Reviewed: Allergy & Precautions, NPO status , Patient's Chart, lab work & pertinent test results  History of Anesthesia Complications Negative for: history of anesthetic complications  Airway Mallampati: II  TM Distance: >3 FB Neck ROM: Full    Dental no notable dental hx. (+) Dental Advisory Given   Pulmonary sleep apnea , former smoker,    Pulmonary exam normal breath sounds clear to auscultation       Cardiovascular hypertension, Pt. on medications + angina with exertion + CAD  Normal cardiovascular exam Rhythm:Regular Rate:Normal  LM: Distal LM calcific 80% stenosis LAD: Prox 40% stenosis LCx: Ostial 40% stenosis RCA: Moderately calcified vessel        Ostial calcific 60% stenosis        Distal RCA focal 40% stenosis        Ostial PDA 50% stenosis LVEDP normal  Overnight observation  IMPRESSIONS    1. Left ventricular ejection fraction, by visual estimation, is 55 to 60%. The left ventricle has normal function. Normal left ventricular size. There is no left ventricular hypertrophy.  2. Left ventricular diastolic Doppler parameters are consistent with impaired relaxation pattern of LV diastolic filling.  3. Global right ventricle has normal systolic function.The right ventricular size is normal. No increase in right ventricular wall thickness.  4. Left atrial size was normal.  5. Right atrial size was normal.  6. The mitral valve is normal in structure. No evidence of mitral valve regurgitation. No evidence of mitral stenosis.  7. The tricuspid valve is normal in structure. Tricuspid valve regurgitation was not visualized by color flow Doppler.  8. The aortic valve is normal in structure. Aortic valve regurgitation was not visualized by color flow Doppler. Structurally normal aortic valve, with no evidence of sclerosis or  stenosis.  9. The pulmonic valve was normal in structure. Pulmonic valve regurgitation is not visualized by color flow Doppler. 10. The inferior vena cava is normal in size with greater than 50% respiratory variability, suggesting right atrial pressure of 3 mmHg.  CVTS consult for CABG   Neuro/Psych PSYCHIATRIC DISORDERS Anxiety negative neurological ROS     GI/Hepatic Neg liver ROS, GERD  Controlled,  Endo/Other  diabetes, Type 2  Renal/GU negative Renal ROS  negative genitourinary   Musculoskeletal negative musculoskeletal ROS (+)   Abdominal   Peds negative pediatric ROS (+)  Hematology negative hematology ROS (+)   Anesthesia Other Findings   Reproductive/Obstetrics negative OB ROS                           Anesthesia Physical Anesthesia Plan  ASA: IV  Anesthesia Plan: General   Post-op Pain Management:    Induction: Intravenous  PONV Risk Score and Plan: 2 and Ondansetron and Dexamethasone  Airway Management Planned: Oral ETT  Additional Equipment: Arterial line, PA Cath, 3D TEE and Ultrasound Guidance Line Placement  Intra-op Plan:   Post-operative Plan: Post-operative intubation/ventilation  Informed Consent: I have reviewed the patients History and Physical, chart, labs and discussed the procedure including the risks, benefits and alternatives for the proposed anesthesia with the patient or authorized representative who has indicated his/her understanding and acceptance.     Dental advisory given  Plan Discussed with: CRNA, Anesthesiologist and Surgeon  Anesthesia Plan Comments:        Anesthesia Quick Evaluation

## 2019-08-13 NOTE — Progress Notes (Signed)
Pre op vascular       has been completed. Preliminary results can be found under CV proc through chart review. Boe Deans, BS, RDMS, RVT   

## 2019-08-13 NOTE — Progress Notes (Signed)
Subjective:  No chest pain  Objective:  Vital Signs in the last 24 hours: Temp:  [97.6 F (36.4 C)-98.4 F (36.9 C)] 97.6 F (36.4 C) (09/23 0421) Pulse Rate:  [76-97] 82 (09/23 0421) Resp:  [5-32] 20 (09/23 0421) BP: (121-166)/(60-97) 143/75 (09/23 0421) SpO2:  [96 %-100 %] 98 % (09/23 0421) Weight:  [90.7 kg-90.8 kg] 90.8 kg (09/23 0518)  Intake/Output from previous day: 09/22 0701 - 09/23 0700 In: 260 [P.O.:260] Out: 800 [Urine:800]  Physical Exam Constitutional: He is oriented to person, place, and time. He appears well-developed and well-nourished. No distress.  HENT:  Head: Normocephalic and atraumatic.  Eyes: Pupils are equal, round, and reactive to light. Conjunctivae are normal.  Neck: No JVD present.  Cardiovascular: Normal rate, regular rhythm and intact distal pulses.  No murmur heard. Pulmonary/Chest: Effort normal and breath sounds normal. He has no wheezes. He has no rales.  Abdominal: Soft. Bowel sounds are normal. There is no rebound.  Musculoskeletal:        General: No edema.  Lymphadenopathy:    He has no cervical adenopathy.  Neurological: He is alert and oriented to person, place, and time. No cranial nerve deficit.  Skin: Skin is warm and dry.  Psychiatric: He has a normal mood and affect.  Nursing note and vitals reviewed.   Lab Results: BMP Recent Labs    08/04/19 1414  NA 144  K 3.5  CL 100  CO2 25  GLUCOSE 104*  BUN 21  CREATININE 1.21  CALCIUM 9.4  GFRNONAA 57*  GFRAA 66    CBC No results for input(s): WBC, RBC, HGB, HCT, PLT, MCV, MCH, MCHC, RDW, LYMPHSABS, MONOABS, EOSABS, BASOSABS in the last 168 hours.  Invalid input(s): NEUTRABS  HEMOGLOBIN A1C Lab Results  Component Value Date   HGBA1C 6.2 05/30/2019   MPG 160 01/31/2009    Lipid Panel     Component Value Date/Time   CHOL  02/01/2009 0601    123        ATP III CLASSIFICATION:  <200     mg/dL   Desirable  200-239  mg/dL   Borderline High  >=240    mg/dL    High          TRIG 116 02/01/2009 0601   HDL 31 (L) 02/01/2009 0601   CHOLHDL 4.0 02/01/2009 0601   VLDL 23 02/01/2009 0601   LDLCALC  02/01/2009 0601    69        Total Cholesterol/HDL:CHD Risk Coronary Heart Disease Risk Table                     Men   Women  1/2 Average Risk   3.4   3.3  Average Risk       5.0   4.4  2 X Average Risk   9.6   7.1  3 X Average Risk  23.4   11.0        Use the calculated Patient Ratio above and the CHD Risk Table to determine the patient's CHD Risk.        ATP III CLASSIFICATION (LDL):  <100     mg/dL   Optimal  100-129  mg/dL   Near or Above                    Optimal  130-159  mg/dL   Borderline  160-189  mg/dL   High  >190     mg/dL   Very  High    Cardiac Studies:  EKG 08/04/2019: Sinus rhythm 82 bpm. Early R wave transition. Otherwise normal EKG.  Coronary angiography 08/13/2019: LM: Distal LM calcific 80% stenosis LAD: Prox 40% stenosis LCx: Ostial 40% stenosis RCA: Moderately calcified vessel        Ostial calcific 60% stenosis        Distal RCA focal 40% stenosis        Ostial PDA 50% stenosis LVEDP normal  Overnight observation CVTS consult for CABG  Echocardiogram 01/24/2019 : Left ventricle cavity is normal in size. Moderate concentric hypertrophy of the left ventricle. Normal global wall motion. Normal diastolic filling pattern. Calculated EF 55%. Left atrial cavity is slightly dilated at 4.5 cm. Structurally normal tricuspid valve with trace regurgitation. The aortic root is mildly dilated at 3.9 cm.   Assessment & Recommendations:  78 y/o Caucasian male with hypertension, type 2 DM, former smoker, exertional chest pain and dyspnea, severe CAD, including distal left main disease  CAD: Severe distal left main and moderate LAD, ostial RCA disease. Plan for CABG tomorrow Continue aspirin 81 mg daily.  Switched pravastatin to rosuvastatin 20 mg daily. Continue amlodipine 10 mg daily.  Switch diltiazem to  metoprolol succinate 50 mg daily. Continue Imdur 30 mg daily. Echocardiogram pending.  Hypertension: Continue above medications, along with hydrochlorothiazide 25 mg daily. On Benicar 40 mg at home. Continue irbesartan 150 mg daily while inpatient.    [name], M.D. 08/13/2019, 10:30 AM [facility] Pager: [phone] Office: [phone] If no answer: [phone]

## 2019-08-13 NOTE — Progress Notes (Signed)
  Echocardiogram 2D Echocardiogram has been performed.  Jennette Dubin 08/13/2019, 4:12 PM

## 2019-08-13 NOTE — Progress Notes (Signed)
[id number] Gave pt OHS booklet, care guide and in the tube handout. Discussed sternal precautions and moving in the tube. Discussed the importance of IS and walking for lung rehab. Gave pt IS and he demonstrated 1000 - 1250 ml correctly. Wrote down how to view pre op video. Pt stated he has someone to assist with care after discharge. Assisted to bathroom and back to bed. No CP. Will follow up after surgery.  Graylon Good RN BSN 08/13/2019 2:24 PM

## 2019-08-13 NOTE — Patient Outreach (Signed)
  [facility]) Care Management Chronic Special Needs Program    08/13/2019  Name: [name], [name]: January 25, 1941  MRN: [id number]   [name] is enrolled in a chronic special needs plan for Diabetes. Client admitted to [facility] in 08/12/19 for a scheduled lt heart cath. Individualized care plan sent to [facility] for admission  [facility] will continue to follow. [name] RN, Jackquline Denmark, CDE Chronic Care Management Coordinator [facility] Management [phone]

## 2019-08-13 NOTE — Consult Note (Signed)
[address]       [place] [address]             [phone]        Timothy [name] [place] Medical Record [id number] Date of Birth: 04/25/1941  Referring: No ref. provider found Primary Care: Tisovec, Fransico Him, MD Primary Cardiologist:No primary care provider on file.  Chief Complaint:   No chief complaint on file. Substernal chest pain with exertion  History of Present Illness:     Pleasant 78 yo man followed by Dr. Virgina Jock who relates history of chest pain for at least 1 year.  This typically occurred with exertion.  He has been feeling better until quite recently due to medical therapy.  Now he experiences substernal pain with almost any activity which is prolonged. He can still achieve relief with rest.  He endorses shortness of breath but no orthopnea.  He denies the presence of a known heart attack.  He also denies having had a stroke.  Patient underwent sub-maximal stress test.  Due to progressive symptoms he underwent left heart catheterization yesterday which demonstrated severe left main disease.  Consult is received for CABG.   Current Activity/ Functional Status: Patient is independent with mobility/ambulation, transfers, ADL's, IADL's.   Zubrod Score: At the time of surgery this patients most appropriate activity status/level should be described as: []     0    Normal activity, no symptoms []     1    Restricted in physical strenuous activity but ambulatory, able to do out light work []     2    Ambulatory and capable of self care, unable to do work activities, up and about                 more than 50%  Of the time                            []     3    Only limited self care, in bed greater than 50% of waking hours []     4    Completely disabled, no self care, confined to bed or chair []     5    Moribund  Past Medical History:  Diagnosis Date   Diabetes mellitus without complication ([place])    Elevated cholesterol    GERD  (gastroesophageal reflux disease)    Hyperplastic colonic polyp    2010   Hypertension    Sleep apnea    no cpap   Tubular adenoma of colon    2010    Past Surgical History:  Procedure Laterality Date   CARPAL TUNNEL RELEASE Right 09/11/2017   Procedure: RIGHT CARPAL TUNNEL RELEASE;  Surgeon: Daryll Brod, MD;  Location: [facility];  Service: Orthopedics;  Laterality: Right;   LEFT HEART CATH AND CORONARY ANGIOGRAPHY N/A 08/12/2019   Procedure: LEFT HEART CATH AND CORONARY ANGIOGRAPHY;  Surgeon: [name], MD;  Location: [facility];  Service: Cardiovascular;  Laterality: N/A;   ROTATOR CUFF REPAIR      Social History   Tobacco Use  Smoking Status Former Smoker   Packs/day: 1.00   Types: Cigarettes   Quit date: 1975   Years since quitting: 45.7  Smokeless Tobacco Never Used    Social History   Substance and Sexual Activity  Alcohol Use Yes   Comment: occasional     No Known Allergies  Current Facility-Administered Medications  Medication Dose Route Frequency Provider Last Rate Last Dose   0.9 %  sodium chloride infusion  250 mL Intravenous PRN Patwardhan, Manish J, MD       acetaminophen (TYLENOL) tablet 650 mg  650 mg Oral Q4H PRN Patwardhan, Manish J, MD       amLODipine (NORVASC) tablet 10 mg  10 mg Oral QHS Patwardhan, Manish J, MD   10 mg at 08/12/19 2200   aspirin EC tablet 81 mg  81 mg Oral Daily Patwardhan, Manish J, MD   81 mg at 08/13/19 0930   [START ON 08/14/2019] cefUROXime (ZINACEF) 1.5 g in sodium chloride 0.9 % 100 mL IVPB  1.5 g Intravenous To OR Wonda Olds, MD       [START ON 08/14/2019] cefUROXime (ZINACEF) 750 mg in sodium chloride 0.9 % 100 mL IVPB  750 mg Intravenous To OR Diontay Rosencrans, Glenice Bow, MD       [START ON 08/14/2019] dexmedetomidine (PRECEDEX) 400 MCG/100ML (4 mcg/mL) infusion  0.1-0.7 mcg/kg/hr Intravenous To OR Keyera Hattabaugh, Glenice Bow, MD       [START ON 08/14/2019] DOPamine (INTROPIN) 800 mg  in dextrose 5 % 250 mL (3.2 mg/mL) infusion  0-10 mcg/kg/min Intravenous To OR Damonica Chopra, Glenice Bow, MD       [START ON 08/14/2019] EPINEPHrine (ADRENALIN) 4 mg in NS 250 mL (0.016 mg/mL) premix infusion  0-10 mcg/min Intravenous To OR Radiance Deady, Glenice Bow, MD       [START ON 08/14/2019] heparin 2,500 Units, papaverine 30 mg in electrolyte-148 (PLASMALYTE-148) 500 mL irrigation   Irrigation To OR Damisha Wolff, Glenice Bow, MD       [START ON 08/14/2019] heparin 30,000 units/NS 1000 mL solution for CELLSAVER   Other To OR Aubrianne Molyneux, Glenice Bow, MD       hydrochlorothiazide (HYDRODIURIL) tablet 25 mg  25 mg Oral BID Patwardhan, Manish J, MD   25 mg at 08/13/19 0931   [START ON 08/14/2019] insulin regular, human (MYXREDLIN) 100 units/ 100 mL infusion   Intravenous To OR Noble Bodie, Glenice Bow, MD       [START ON 08/14/2019] magnesium sulfate (IV Push/IM) injection 40 mEq  40 mEq Other To OR Jody Silas, Glenice Bow, MD       metoprolol succinate (TOPROL-XL) 24 hr tablet 50 mg  50 mg Oral Daily Patwardhan, Manish J, MD   50 mg at 08/13/19 0930   [START ON 08/14/2019] milrinone (PRIMACOR) 20 MG/100 ML (0.2 mg/mL) infusion  0.3 mcg/kg/min Intravenous To OR Kaaren Nass, Glenice Bow, MD       [START ON 08/14/2019] nitroGLYCERIN 50 mg in dextrose 5 % 250 mL (0.2 mg/mL) infusion  2-200 mcg/min Intravenous To OR Idy Rawling, Glenice Bow, MD       ondansetron (ZOFRAN) injection 4 mg  4 mg Intravenous Q6H PRN Patwardhan, Manish J, MD       pantoprazole (PROTONIX) EC tablet 40 mg  40 mg Oral Daily Patwardhan, Manish J, MD   40 mg at 08/13/19 0930   [START ON 08/14/2019] phenylephrine (NEOSYNEPHRINE) 20-0.9 MG/250ML-% infusion  30-200 mcg/min Intravenous To OR Edell Mesenbrink, Glenice Bow, MD       [START ON 08/14/2019] potassium chloride injection 80 mEq  80 mEq Other To OR Nakai Yard, Glenice Bow, MD       rosuvastatin (CRESTOR) tablet 10 mg  10 mg Oral q1800 Patwardhan, Manish J, MD       sertraline (ZOLOFT) tablet 100 mg  100 mg Oral Daily Patwardhan, Reynold Bowen, MD  100 mg at 08/13/19 0930   sodium chloride flush (NS) 0.9 % injection 3 mL  3 mL Intravenous Q12H Patwardhan, Manish J, MD   3 mL at 08/13/19 0931   sodium chloride flush (NS) 0.9 % injection 3 mL  3 mL Intravenous Q12H Patwardhan, Manish J, MD   3 mL at 08/13/19 0931   sodium chloride flush (NS) 0.9 % injection 3 mL  3 mL Intravenous PRN Patwardhan, Reynold Bowen, MD       [START ON 08/14/2019] tranexamic acid (CYKLOKAPRON) 2,500 mg in sodium chloride 0.9 % 250 mL (10 mg/mL) infusion  1.5 mg/kg/hr Intravenous To OR Elzie Sheets, Glenice Bow, MD       [START ON 08/14/2019] tranexamic acid (CYKLOKAPRON) bolus via infusion - over 30 minutes 1,362 mg  15 mg/kg Intravenous To OR Shenekia Riess, Glenice Bow, MD       [START ON 08/14/2019] tranexamic acid (CYKLOKAPRON) pump prime solution 182 mg  2 mg/kg Intracatheter To OR Alfred Harrel, Glenice Bow, MD       [START ON 08/14/2019] vancomycin (VANCOCIN) 1,000 mg in sodium chloride 0.9 % 1,000 mL irrigation   Irrigation To OR Ishaan Villamar, Glenice Bow, MD       [START ON 08/14/2019] vancomycin (VANCOCIN) 1,500 mg in sodium chloride 0.9 % 250 mL IVPB  1,500 mg Intravenous To OR Jacayla Nordell, Glenice Bow, MD        Facility-Administered Medications Prior to Admission  Medication Dose Route Frequency Provider Last Rate Last Dose   0.9 %  sodium chloride infusion  500 mL Intravenous Once Ladene Artist, MD       Medications Prior to Admission  Medication Sig Dispense Refill Last Dose   amLODipine (NORVASC) 10 MG tablet Take 10 mg by mouth at bedtime.   Past Week at Unknown time   aspirin EC 81 MG tablet Take 1 tablet (81 mg total) by mouth daily. 90 tablet 3 Past Week at Unknown time   diltiazem (TIAZAC) 360 MG 24 hr capsule Take 360 mg by mouth daily.    08/11/2019 at Unknown time   metFORMIN (GLUCOPHAGE) 850 MG tablet Take 850 mg by mouth 2 (two) times daily with a meal.   08/10/2019   nitroGLYCERIN (NITROSTAT) 0.4 MG SL tablet Place 1 tablet (0.4 mg total) under the tongue every 5 (five)  minutes as needed for chest pain. 30 tablet 1    olmesartan (BENICAR) 40 MG tablet Take 40 mg by mouth daily.   Past Week at Unknown time   omeprazole (PRILOSEC) 20 MG capsule Take 1 capsule (20 mg total) by mouth 2 (two) times daily. Take on an empty stomach 20 - 30 minutes prior to food. (Patient taking differently: Take 20 mg by mouth daily. Take on an empty stomach 20 - 30 minutes prior to food.) 180 capsule 3 Past Week at Unknown time   pravastatin (PRAVACHOL) 40 MG tablet Take 40 mg by mouth daily.    08/09/2019   sertraline (ZOLOFT) 100 MG tablet Take 100 mg by mouth daily.   08/09/2019   [DISCONTINUED] hydrochlorothiazide (HYDRODIURIL) 25 MG tablet Take 25 mg by mouth 2 (two) times daily.    08/11/2019 at Unknown time   bismuth subsalicylate (PEPTO-BISMOL) 262 MG chewable tablet Chew 2 tablets (524 mg total) by mouth 4 (four) times daily. (Patient not taking: Reported on 08/08/2019) 112 tablet 0 Not Taking at Unknown time   HYDROcodone-acetaminophen (NORCO) 5-325 MG tablet Take 1 tablet by mouth every 6 (six) hours as needed. (Patient not  taking: Reported on 08/08/2019) 20 tablet 0 Not Taking at Unknown time   isosorbide mononitrate (IMDUR) 30 MG 24 hr tablet Take 1 tablet (30 mg total) by mouth daily. (Patient not taking: Reported on 08/08/2019) 90 tablet 3 Not Taking at Unknown time   sucralfate (CARAFATE) 1 GM/10ML suspension Take 10 mLs (1 g total) by mouth 4 (four) times daily. (Patient not taking: Reported on 08/08/2019) 420 mL 1 Not Taking at Unknown time    Family History  Problem Relation Age of Onset   Congestive Heart Failure Mother    Stroke Father      Review of Systems:   ROS A comprehensive review of systems was negative except for: Constitutional: positive for decreased energy level     Cardiac Review of Systems: Y or  [    ]= no  Chest Pain [    ]  Resting SOB [ n  ] Exertional SOB  [  ]  Orthopnea [  ]   Pedal Edema [   ]    Palpitations [ n ] Syncope  [  ]     Presyncope [   ]  General Review of Systems: [Y] = yes [  ]=no Constitional: recent weight change [  ]; anorexia [  ]; fatigue [  ]; nausea [  ]; night sweats [  ]; fever [  ]; or chills [  ]                                                               Dental: Last Dentist visit:   Eye : blurred vision [  ]; diplopia [ n  ]; vision changes [ n ];  Amaurosis fugax[  ]; Resp: cough [ n ];  wheezing[ n ];  hemoptysis[  ]; shortness of breath[  ]; paroxysmal nocturnal dyspnea[  ]; dyspnea on exertion[  ]; or orthopnea[  ];  GI:  gallstones[  ], vomiting[  ];  dysphagia[ n ]; melena[  ];  hematochezia [  ]; heartburn[  ];   Hx of  Colonoscopy[  ]; GU: kidney stones [  ]; hematuria[  ];   dysuria [n  ];  nocturia[  ];  history of     obstruction [  ]; urinary frequency [n  ]             Skin: rash, swelling[  ];, hair loss[  ];  peripheral edema[  ];  or itching[  ]; Musculosketetal: myalgias[n  ];  joint swelling[  ];  joint erythema[  ];  joint pain[  ];  back pain[  ];  Heme/Lymph: bruising[  ];  bleeding[  ];  anemia[  ];  Neuro: TIA[ n ];  headaches[  ];  stroke[n  ];  vertigo[  ];  seizures[  ];   paresthesias[  ];  difficulty walking[  ];  Psych:depression[n  ]; anxiety[n  ];  Endocrine: diabetes[n  ];  thyroid dysfunction[  ];                Physical Exam: BP (!) 143/75    Pulse 82    Temp 97.6 F (36.4 C) (Oral)    Resp 20    Ht 5\' 6"  (1.676 m)    Wt 90.8 kg  SpO2 98%    BMI 32.31 kg/m    General appearance: alert, cooperative and no distress Head: Normocephalic, without obvious abnormality, atraumatic Neck: no adenopathy, no carotid bruit, no JVD, supple, symmetrical, trachea midline and thyroid not enlarged, symmetric, no tenderness/mass/nodules Resp: clear to auscultation bilaterally Cardio: regular rate and rhythm, S1, S2 normal, no murmur, click, rub or gallop GI: soft, non-tender; bowel sounds normal; no masses,  no organomegaly Extremities: extremities normal, atraumatic,  no cyanosis or edema Neurologic: Grossly normal  Diagnostic Studies & Laboratory data:     Recent Radiology Findings:   No results found.   I have independently reviewed the results of Left heart catheterization and discussed with the patient and his significant other.   Recent Lab Findings: Lab Results  Component Value Date   WBC 9.3 08/04/2019   HGB 12.3 (L) 08/04/2019   HCT 36.5 (L) 08/04/2019   PLT 201 08/04/2019   GLUCOSE 174 (H) 08/13/2019   CHOL  02/01/2009    123        ATP III CLASSIFICATION:  <200     mg/dL   Desirable  200-239  mg/dL   Borderline High  >=240    mg/dL   High          TRIG 116 02/01/2009   HDL 31 (L) 02/01/2009   LDLCALC  02/01/2009    69        Total Cholesterol/HDL:CHD Risk Coronary Heart Disease Risk Table                     Men   Women  1/2 Average Risk   3.4   3.3  Average Risk       5.0   4.4  2 X Average Risk   9.6   7.1  3 X Average Risk  23.4   11.0        Use the calculated Patient Ratio above and the CHD Risk Table to determine the patient's CHD Risk.        ATP III CLASSIFICATION (LDL):  <100     mg/dL   Optimal  100-129  mg/dL   Near or Above                    Optimal  130-159  mg/dL   Borderline  160-189  mg/dL   High  >190     mg/dL   Very High   ALT 30 01/31/2009   AST 59 (H) 01/31/2009   NA 139 08/13/2019   K 3.7 08/13/2019   CL 104 08/13/2019   CREATININE 1.09 08/13/2019   BUN 15 08/13/2019   CO2 26 08/13/2019   INR 1.0 01/31/2009   HGBA1C 6.2 05/30/2019      Assessment / Plan:       78 yo otherwise healthy man with LM CAD. He is a great candidate for surgery; I have discussed the risks and benefits and he wishes to proceed for surgery tomorrow.    I  spent 40 minutes counseling the patient face to face.   Genavieve Mangiapane Z. [name], MD [phone] 08/13/2019 1:34 PM

## 2019-08-14 ENCOUNTER — Inpatient Hospital Stay (HOSPITAL_COMMUNITY)
Admission: AD | Disposition: A | Discharge status: Home / Self Care | Payer: Self-pay | Source: Home / Self Care | Attending: Cardiothoracic Surgery

## 2019-08-14 ENCOUNTER — Ambulatory Visit (HOSPITAL_COMMUNITY): Payer: HMO

## 2019-08-14 ENCOUNTER — Inpatient Hospital Stay (HOSPITAL_COMMUNITY): Payer: HMO | Admitting: Certified Registered Nurse Anesthetist

## 2019-08-14 ENCOUNTER — Inpatient Hospital Stay (HOSPITAL_COMMUNITY): Payer: HMO

## 2019-08-14 DIAGNOSIS — Z951 Presence of aortocoronary bypass graft: Secondary | ICD-10-CM

## 2019-08-14 DIAGNOSIS — I25110 Atherosclerotic heart disease of native coronary artery with unstable angina pectoris: Secondary | ICD-10-CM

## 2019-08-14 HISTORY — PX: CORONARY ARTERY BYPASS GRAFT: SHX141

## 2019-08-14 HISTORY — PX: TEE WITHOUT CARDIOVERSION: SHX5443

## 2019-08-14 LAB — POCT I-STAT 7, (LYTES, BLD GAS, ICA,H+H)
Acid-Base Excess: 1 mmol/L (ref 0.0–2.0)
Acid-base deficit: 4 mmol/L — ABNORMAL HIGH (ref 0.0–2.0)
Acid-base deficit: 3 mmol/L — ABNORMAL HIGH (ref 0.0–2.0)
Bicarbonate: 25.7 mmol/L (ref 20.0–28.0)
Bicarbonate: 22.2 mmol/L (ref 20.0–28.0)
Bicarbonate: 22.1 mmol/L (ref 20.0–28.0)
Calcium, Ion: 0.98 mmol/L — ABNORMAL LOW (ref 1.15–1.40)
Calcium, Ion: 1.16 mmol/L (ref 1.15–1.40)
Calcium, Ion: 1.16 mmol/L (ref 1.15–1.40)
HCT: 24 % — ABNORMAL LOW (ref 39.0–52.0)
HCT: 29 % — ABNORMAL LOW (ref 39.0–52.0)
HCT: 26 % — ABNORMAL LOW (ref 39.0–52.0)
Hemoglobin: 8.2 g/dL — ABNORMAL LOW (ref 13.0–17.0)
Hemoglobin: 9.9 g/dL — ABNORMAL LOW (ref 13.0–17.0)
Hemoglobin: 8.8 g/dL — ABNORMAL LOW (ref 13.0–17.0)
O2 Saturation: 100 %
O2 Saturation: 98 %
O2 Saturation: 99 %
Patient temperature: 35.1
Patient temperature: 36.2
Potassium: 4.3 mmol/L (ref 3.5–5.1)
Potassium: 3.3 mmol/L — ABNORMAL LOW (ref 3.5–5.1)
Potassium: 3.8 mmol/L (ref 3.5–5.1)
Sodium: 139 mmol/L (ref 135–145)
Sodium: 142 mmol/L (ref 135–145)
Sodium: 141 mmol/L (ref 135–145)
TCO2: 27 mmol/L (ref 22–32)
TCO2: 24 mmol/L (ref 22–32)
TCO2: 23 mmol/L (ref 22–32)
pCO2 arterial: 41.5 mmHg (ref 32.0–48.0)
pCO2 arterial: 39.2 mmHg (ref 32.0–48.0)
pCO2 arterial: 36.8 mmHg (ref 32.0–48.0)
pH, Arterial: 7.4 (ref 7.350–7.450)
pH, Arterial: 7.352 (ref 7.350–7.450)
pH, Arterial: 7.383 (ref 7.350–7.450)
pO2, Arterial: 380 mmHg — ABNORMAL HIGH (ref 83.0–108.0)
pO2, Arterial: 94 mmHg (ref 83.0–108.0)
pO2, Arterial: 116 mmHg — ABNORMAL HIGH (ref 83.0–108.0)

## 2019-08-14 LAB — GLUCOSE, CAPILLARY
Glucose-Capillary: 99 mg/dL (ref 70–99)
Glucose-Capillary: 106 mg/dL — ABNORMAL HIGH (ref 70–99)
Glucose-Capillary: 109 mg/dL — ABNORMAL HIGH (ref 70–99)
Glucose-Capillary: 116 mg/dL — ABNORMAL HIGH (ref 70–99)
Glucose-Capillary: 101 mg/dL — ABNORMAL HIGH (ref 70–99)
Glucose-Capillary: 60 mg/dL — ABNORMAL LOW (ref 70–99)
Glucose-Capillary: 113 mg/dL — ABNORMAL HIGH (ref 70–99)
Glucose-Capillary: 122 mg/dL — ABNORMAL HIGH (ref 70–99)
Glucose-Capillary: 135 mg/dL — ABNORMAL HIGH (ref 70–99)
Glucose-Capillary: 119 mg/dL — ABNORMAL HIGH (ref 70–99)
Glucose-Capillary: 128 mg/dL — ABNORMAL HIGH (ref 70–99)
Glucose-Capillary: 120 mg/dL — ABNORMAL HIGH (ref 70–99)

## 2019-08-14 LAB — PLATELET COUNT: Platelets: 153 10*3/uL (ref 150–400)

## 2019-08-14 LAB — CBC
HCT: 39.5 % (ref 39.0–52.0)
HCT: 30 % — ABNORMAL LOW (ref 39.0–52.0)
HCT: 27.6 % — ABNORMAL LOW (ref 39.0–52.0)
Hemoglobin: 12.6 g/dL — ABNORMAL LOW (ref 13.0–17.0)
Hemoglobin: 9.8 g/dL — ABNORMAL LOW (ref 13.0–17.0)
Hemoglobin: 9.2 g/dL — ABNORMAL LOW (ref 13.0–17.0)
MCH: 27.5 pg (ref 26.0–34.0)
MCH: 27.8 pg (ref 26.0–34.0)
MCH: 29 pg (ref 26.0–34.0)
MCHC: 31.9 g/dL (ref 30.0–36.0)
MCHC: 32.7 g/dL (ref 30.0–36.0)
MCHC: 33.3 g/dL (ref 30.0–36.0)
MCV: 86.2 fL (ref 80.0–100.0)
MCV: 85 fL (ref 80.0–100.0)
MCV: 87.1 fL (ref 80.0–100.0)
Platelets: 195 10*3/uL (ref 150–400)
Platelets: 120 10*3/uL — ABNORMAL LOW (ref 150–400)
Platelets: 138 10*3/uL — ABNORMAL LOW (ref 150–400)
RBC: 4.58 MIL/uL (ref 4.22–5.81)
RBC: 3.53 MIL/uL — ABNORMAL LOW (ref 4.22–5.81)
RBC: 3.17 MIL/uL — ABNORMAL LOW (ref 4.22–5.81)
RDW: 13.7 % (ref 11.5–15.5)
RDW: 13.5 % (ref 11.5–15.5)
RDW: 13.5 % (ref 11.5–15.5)
WBC: 8.8 10*3/uL (ref 4.0–10.5)
WBC: 14.3 10*3/uL — ABNORMAL HIGH (ref 4.0–10.5)
WBC: 13.6 10*3/uL — ABNORMAL HIGH (ref 4.0–10.5)
nRBC: 0 % (ref 0.0–0.2)
nRBC: 0 % (ref 0.0–0.2)
nRBC: 0 % (ref 0.0–0.2)

## 2019-08-14 LAB — POCT I-STAT, CHEM 8
BUN: 21 mg/dL (ref 8–23)
BUN: 20 mg/dL (ref 8–23)
BUN: 18 mg/dL (ref 8–23)
BUN: 19 mg/dL (ref 8–23)
Calcium, Ion: 1.25 mmol/L (ref 1.15–1.40)
Calcium, Ion: 1.2 mmol/L (ref 1.15–1.40)
Calcium, Ion: 1.09 mmol/L — ABNORMAL LOW (ref 1.15–1.40)
Calcium, Ion: 1.07 mmol/L — ABNORMAL LOW (ref 1.15–1.40)
Chloride: 101 mmol/L (ref 98–111)
Chloride: 103 mmol/L (ref 98–111)
Chloride: 100 mmol/L (ref 98–111)
Chloride: 102 mmol/L (ref 98–111)
Creatinine, Ser: 1.1 mg/dL (ref 0.61–1.24)
Creatinine, Ser: 1.1 mg/dL (ref 0.61–1.24)
Creatinine, Ser: 1 mg/dL (ref 0.61–1.24)
Creatinine, Ser: 1 mg/dL (ref 0.61–1.24)
Glucose, Bld: 135 mg/dL — ABNORMAL HIGH (ref 70–99)
Glucose, Bld: 142 mg/dL — ABNORMAL HIGH (ref 70–99)
Glucose, Bld: 145 mg/dL — ABNORMAL HIGH (ref 70–99)
Glucose, Bld: 133 mg/dL — ABNORMAL HIGH (ref 70–99)
HCT: 36 % — ABNORMAL LOW (ref 39.0–52.0)
HCT: 31 % — ABNORMAL LOW (ref 39.0–52.0)
HCT: 26 % — ABNORMAL LOW (ref 39.0–52.0)
HCT: 26 % — ABNORMAL LOW (ref 39.0–52.0)
Hemoglobin: 12.2 g/dL — ABNORMAL LOW (ref 13.0–17.0)
Hemoglobin: 10.5 g/dL — ABNORMAL LOW (ref 13.0–17.0)
Hemoglobin: 8.8 g/dL — ABNORMAL LOW (ref 13.0–17.0)
Hemoglobin: 8.8 g/dL — ABNORMAL LOW (ref 13.0–17.0)
Potassium: 4.1 mmol/L (ref 3.5–5.1)
Potassium: 3.7 mmol/L (ref 3.5–5.1)
Potassium: 4.3 mmol/L (ref 3.5–5.1)
Potassium: 3.6 mmol/L (ref 3.5–5.1)
Sodium: 140 mmol/L (ref 135–145)
Sodium: 140 mmol/L (ref 135–145)
Sodium: 141 mmol/L (ref 135–145)
Sodium: 140 mmol/L (ref 135–145)
TCO2: 28 mmol/L (ref 22–32)
TCO2: 24 mmol/L (ref 22–32)
TCO2: 24 mmol/L (ref 22–32)
TCO2: 23 mmol/L (ref 22–32)

## 2019-08-14 LAB — COMPREHENSIVE METABOLIC PANEL
ALT: 18 U/L (ref 0–44)
AST: 35 U/L (ref 15–41)
Albumin: 4 g/dL (ref 3.5–5.0)
Alkaline Phosphatase: 90 U/L (ref 38–126)
Anion gap: 12 (ref 5–15)
BUN: 20 mg/dL (ref 8–23)
CO2: 24 mmol/L (ref 22–32)
Calcium: 8.9 mg/dL (ref 8.9–10.3)
Chloride: 102 mmol/L (ref 98–111)
Creatinine, Ser: 1.27 mg/dL — ABNORMAL HIGH (ref 0.61–1.24)
GFR calc Af Amer: >60 mL/min (ref >60)
GFR calc non Af Amer: 54 mL/min — ABNORMAL LOW (ref >60)
Glucose, Bld: 128 mg/dL — ABNORMAL HIGH (ref 70–99)
Potassium: 3.8 mmol/L (ref 3.5–5.1)
Sodium: 138 mmol/L (ref 135–145)
Total Bilirubin: 0.3 mg/dL (ref 0.3–1.2)
Total Protein: 6.8 g/dL (ref 6.5–8.1)

## 2019-08-14 LAB — PROTIME-INR
INR: 1 (ref 0.8–1.2)
INR: 1.3 — ABNORMAL HIGH (ref 0.8–1.2)
Prothrombin Time: 13 seconds (ref 11.4–15.2)
Prothrombin Time: 16.3 seconds — ABNORMAL HIGH (ref 11.4–15.2)

## 2019-08-14 LAB — MAGNESIUM
Magnesium: 2 mg/dL (ref 1.7–2.4)
Magnesium: 2.6 mg/dL — ABNORMAL HIGH (ref 1.7–2.4)

## 2019-08-14 LAB — ECHO INTRAOPERATIVE TEE
Height: 66 in
Weight: 3160.51 oz

## 2019-08-14 LAB — BASIC METABOLIC PANEL
Anion gap: 7 (ref 5–15)
BUN: 15 mg/dL (ref 8–23)
CO2: 23 mmol/L (ref 22–32)
Calcium: 7.7 mg/dL — ABNORMAL LOW (ref 8.9–10.3)
Chloride: 108 mmol/L (ref 98–111)
Creatinine, Ser: 0.96 mg/dL (ref 0.61–1.24)
GFR calc Af Amer: >60 mL/min (ref >60)
GFR calc non Af Amer: >60 mL/min (ref >60)
Glucose, Bld: 134 mg/dL — ABNORMAL HIGH (ref 70–99)
Potassium: 3.5 mmol/L (ref 3.5–5.1)
Sodium: 138 mmol/L (ref 135–145)

## 2019-08-14 LAB — HEMOGLOBIN AND HEMATOCRIT, BLOOD
HCT: 25.8 % — ABNORMAL LOW (ref 39.0–52.0)
Hemoglobin: 8.7 g/dL — ABNORMAL LOW (ref 13.0–17.0)

## 2019-08-14 LAB — APTT: aPTT: 46 seconds — ABNORMAL HIGH (ref 24–36)

## 2019-08-14 SURGERY — CORONARY ARTERY BYPASS GRAFTING (CABG)
Anesthesia: General | Site: Chest

## 2019-08-14 MED ORDER — LACTATED RINGERS IV SOLN
INTRAVENOUS | Status: DC | PRN
  Administered 2019-08-14 (×2): via INTRAVENOUS

## 2019-08-14 MED ORDER — HEMOSTATIC AGENTS (NO CHARGE) OPTIME
TOPICAL | Status: DC | PRN
  Administered 2019-08-14 (×5): 1 via TOPICAL

## 2019-08-14 MED ORDER — PLASMA-LYTE 148 IV SOLN
INTRAVENOUS | Status: DC | PRN
  Administered 2019-08-14: 500 mL

## 2019-08-14 MED ORDER — METOPROLOL TARTRATE 12.5 MG HALF TABLET
12.5000 mg | ORAL_TABLET | Freq: Two times a day (BID) | ORAL | Status: DC
  Administered 2019-08-15 – 2019-08-17 (×6): 12.5 mg via ORAL
  Filled 2019-08-14 (×7): qty 1

## 2019-08-14 MED ORDER — PHENYLEPHRINE HCL-NACL 20-0.9 MG/250ML-% IV SOLN: 0.0000 ug/min | INTRAVENOUS | Status: DC

## 2019-08-14 MED ORDER — SUCCINYLCHOLINE CHLORIDE 200 MG/10ML IV SOSY
PREFILLED_SYRINGE | INTRAVENOUS | Status: AC
  Filled 2019-08-14: qty 10

## 2019-08-14 MED ORDER — INSULIN REGULAR(HUMAN) IN NACL 100-0.9 UT/100ML-% IV SOLN: INTRAVENOUS | Status: DC

## 2019-08-14 MED ORDER — SODIUM CHLORIDE 0.9 % IV SOLN
INTRAVENOUS | Status: DC
  Administered 2019-08-14: 14:00:00 via INTRAVENOUS

## 2019-08-14 MED ORDER — ONDANSETRON HCL 4 MG/2ML IJ SOLN
INTRAMUSCULAR | Status: DC | PRN
  Administered 2019-08-14: 4 mg via INTRAVENOUS

## 2019-08-14 MED ORDER — HEPARIN SODIUM (PORCINE) 1000 UNIT/ML IJ SOLN
INTRAMUSCULAR | Status: DC | PRN
  Administered 2019-08-14: 24000 [IU] via INTRAVENOUS
  Administered 2019-08-14: 5000 [IU] via INTRAVENOUS

## 2019-08-14 MED ORDER — FENTANYL CITRATE (PF) 250 MCG/5ML IJ SOLN
INTRAMUSCULAR | Status: DC | PRN
  Administered 2019-08-14: 100 ug via INTRAVENOUS
  Administered 2019-08-14 (×5): 150 ug via INTRAVENOUS
  Administered 2019-08-14: 100 ug via INTRAVENOUS
  Administered 2019-08-14: 50 ug via INTRAVENOUS
  Administered 2019-08-14: 100 ug via INTRAVENOUS
  Administered 2019-08-14: 150 ug via INTRAVENOUS

## 2019-08-14 MED ORDER — DEXMEDETOMIDINE HCL IN NACL 200 MCG/50ML IV SOLN
0.0000 ug/kg/h | INTRAVENOUS | Status: DC
  Administered 2019-08-14: 0.2 ug/kg/h via INTRAVENOUS
  Filled 2019-08-14: qty 50

## 2019-08-14 MED ORDER — 0.9 % SODIUM CHLORIDE (POUR BTL) OPTIME
TOPICAL | Status: DC | PRN
  Administered 2019-08-14: 5000 mL

## 2019-08-14 MED ORDER — MORPHINE SULFATE (PF) 2 MG/ML IV SOLN
1.0000 mg | INTRAVENOUS | Status: DC | PRN
  Administered 2019-08-14: 4 mg via INTRAVENOUS
  Administered 2019-08-15: 2 mg via INTRAVENOUS
  Filled 2019-08-14: qty 2
  Filled 2019-08-14: qty 1

## 2019-08-14 MED ORDER — METOPROLOL TARTRATE 25 MG/10 ML ORAL SUSPENSION
12.5000 mg | Freq: Two times a day (BID) | ORAL | Status: DC
  Filled 2019-08-14 (×4): qty 5

## 2019-08-14 MED ORDER — STERILE WATER FOR INJECTION IJ SOLN
INTRAMUSCULAR | Status: AC
  Filled 2019-08-14: qty 10

## 2019-08-14 MED ORDER — HEPARIN SODIUM (PORCINE) 1000 UNIT/ML IJ SOLN
INTRAMUSCULAR | Status: AC
  Filled 2019-08-14: qty 1

## 2019-08-14 MED ORDER — ROCURONIUM BROMIDE 10 MG/ML (PF) SYRINGE
PREFILLED_SYRINGE | INTRAVENOUS | Status: AC
  Filled 2019-08-14: qty 10

## 2019-08-14 MED ORDER — FAMOTIDINE IN NACL 20-0.9 MG/50ML-% IV SOLN
20.0000 mg | Freq: Two times a day (BID) | INTRAVENOUS | Status: AC
  Administered 2019-08-14: 14:00:00 20 mg via INTRAVENOUS

## 2019-08-14 MED ORDER — LACTATED RINGERS IV SOLN: INTRAVENOUS | Status: DC

## 2019-08-14 MED ORDER — SODIUM CHLORIDE 0.9 % IV SOLN
INTRAVENOUS | Status: DC | PRN
  Administered 2019-08-14: 750 mg via INTRAVENOUS

## 2019-08-14 MED ORDER — ASPIRIN 81 MG PO CHEW: 324.0000 mg | CHEWABLE_TABLET | Freq: Every day | ORAL | Status: DC

## 2019-08-14 MED ORDER — ROCURONIUM BROMIDE 10 MG/ML (PF) SYRINGE
PREFILLED_SYRINGE | INTRAVENOUS | Status: DC | PRN
  Administered 2019-08-14: 50 mg via INTRAVENOUS
  Administered 2019-08-14: 30 mg via INTRAVENOUS
  Administered 2019-08-14 (×3): 50 mg via INTRAVENOUS

## 2019-08-14 MED ORDER — PROPOFOL 10 MG/ML IV BOLUS
INTRAVENOUS | Status: AC
  Filled 2019-08-14: qty 20

## 2019-08-14 MED ORDER — EPHEDRINE SULFATE 50 MG/ML IJ SOLN
INTRAMUSCULAR | Status: DC | PRN
  Administered 2019-08-14 (×5): 5 mg via INTRAVENOUS

## 2019-08-14 MED ORDER — NITROGLYCERIN IN D5W 200-5 MCG/ML-% IV SOLN: 0.0000 ug/min | INTRAVENOUS | Status: DC

## 2019-08-14 MED ORDER — CHLORHEXIDINE GLUCONATE 0.12 % MT SOLN
15.0000 mL | OROMUCOSAL | Status: AC
  Administered 2019-08-14: 14:00:00 15 mL via OROMUCOSAL

## 2019-08-14 MED ORDER — PROTAMINE SULFATE 10 MG/ML IV SOLN
INTRAVENOUS | Status: AC
  Filled 2019-08-14: qty 25

## 2019-08-14 MED ORDER — BISACODYL 10 MG RE SUPP: 10.0000 mg | Freq: Every day | RECTAL | Status: DC

## 2019-08-14 MED ORDER — LACTATED RINGERS IV SOLN
INTRAVENOUS | Status: DC | PRN
  Administered 2019-08-14: 07:00:00 via INTRAVENOUS

## 2019-08-14 MED ORDER — SODIUM CHLORIDE 0.9% FLUSH: 3.0000 mL | INTRAVENOUS | Status: DC | PRN

## 2019-08-14 MED ORDER — ONDANSETRON HCL 4 MG/2ML IJ SOLN
INTRAMUSCULAR | Status: AC
  Filled 2019-08-14: qty 2

## 2019-08-14 MED ORDER — ACETAMINOPHEN 160 MG/5ML PO SOLN: 1000.0000 mg | Freq: Four times a day (QID) | ORAL | Status: DC

## 2019-08-14 MED ORDER — DOCUSATE SODIUM 100 MG PO CAPS
200.0000 mg | ORAL_CAPSULE | Freq: Every day | ORAL | Status: DC
  Administered 2019-08-15 – 2019-08-18 (×4): 200 mg via ORAL
  Filled 2019-08-14 (×5): qty 2

## 2019-08-14 MED ORDER — FENTANYL CITRATE (PF) 250 MCG/5ML IJ SOLN
INTRAMUSCULAR | Status: AC
  Filled 2019-08-14: qty 25

## 2019-08-14 MED ORDER — ARTIFICIAL TEARS OPHTHALMIC OINT
TOPICAL_OINTMENT | OPHTHALMIC | Status: AC
  Filled 2019-08-14: qty 3.5

## 2019-08-14 MED ORDER — VANCOMYCIN HCL 1000 MG IV SOLR
INTRAVENOUS | Status: DC | PRN
  Administered 2019-08-14: 1000 mL

## 2019-08-14 MED ORDER — VANCOMYCIN HCL IN DEXTROSE 1-5 GM/200ML-% IV SOLN
1000.0000 mg | Freq: Once | INTRAVENOUS | Status: AC
  Administered 2019-08-14: 1000 mg via INTRAVENOUS
  Filled 2019-08-14: qty 200

## 2019-08-14 MED ORDER — MIDAZOLAM HCL (PF) 10 MG/2ML IJ SOLN
INTRAMUSCULAR | Status: AC
  Filled 2019-08-14: qty 2

## 2019-08-14 MED ORDER — PHENYLEPHRINE 40 MCG/ML (10ML) SYRINGE FOR IV PUSH (FOR BLOOD PRESSURE SUPPORT)
PREFILLED_SYRINGE | INTRAVENOUS | Status: DC | PRN
  Administered 2019-08-14 (×4): 40 ug via INTRAVENOUS

## 2019-08-14 MED ORDER — ALBUMIN HUMAN 5 % IV SOLN
INTRAVENOUS | Status: DC | PRN
  Administered 2019-08-14 (×2): via INTRAVENOUS

## 2019-08-14 MED ORDER — ACETAMINOPHEN 650 MG RE SUPP
650.0000 mg | Freq: Once | RECTAL | Status: AC
  Administered 2019-08-14: 650 mg via RECTAL

## 2019-08-14 MED ORDER — PROTAMINE SULFATE 10 MG/ML IV SOLN
INTRAVENOUS | Status: AC
  Filled 2019-08-14: qty 5

## 2019-08-14 MED ORDER — SODIUM CHLORIDE 0.9% FLUSH
3.0000 mL | Freq: Two times a day (BID) | INTRAVENOUS | Status: DC
  Administered 2019-08-15 – 2019-08-19 (×9): 3 mL via INTRAVENOUS

## 2019-08-14 MED ORDER — ACETAMINOPHEN 500 MG PO TABS
1000.0000 mg | ORAL_TABLET | Freq: Four times a day (QID) | ORAL | Status: DC
  Administered 2019-08-14 – 2019-08-18 (×14): 1000 mg via ORAL
  Filled 2019-08-14 (×16): qty 2

## 2019-08-14 MED ORDER — PROPOFOL 10 MG/ML IV BOLUS
INTRAVENOUS | Status: DC | PRN
  Administered 2019-08-14: 110 mg via INTRAVENOUS

## 2019-08-14 MED ORDER — LACTATED RINGERS IV SOLN
INTRAVENOUS | Status: DC | PRN
  Administered 2019-08-14: 08:00:00 via INTRAVENOUS

## 2019-08-14 MED ORDER — PANTOPRAZOLE SODIUM 40 MG PO TBEC
40.0000 mg | DELAYED_RELEASE_TABLET | Freq: Every day | ORAL | Status: DC
  Administered 2019-08-16 – 2019-08-19 (×4): 40 mg via ORAL
  Filled 2019-08-14 (×4): qty 1

## 2019-08-14 MED ORDER — METOPROLOL TARTRATE 5 MG/5ML IV SOLN: 2.5000 mg | INTRAVENOUS | Status: DC | PRN

## 2019-08-14 MED ORDER — ORAL CARE MOUTH RINSE
15.0000 mL | Freq: Two times a day (BID) | OROMUCOSAL | Status: DC
  Administered 2019-08-14 – 2019-08-19 (×7): 15 mL via OROMUCOSAL

## 2019-08-14 MED ORDER — LIDOCAINE 2% (20 MG/ML) 5 ML SYRINGE
INTRAMUSCULAR | Status: AC
  Filled 2019-08-14: qty 5

## 2019-08-14 MED ORDER — ALBUMIN HUMAN 5 % IV SOLN
250.0000 mL | INTRAVENOUS | Status: AC | PRN
  Administered 2019-08-14 (×4): 12.5 g via INTRAVENOUS
  Filled 2019-08-14 (×2): qty 250

## 2019-08-14 MED ORDER — POTASSIUM CHLORIDE 10 MEQ/50ML IV SOLN
10.0000 meq | INTRAVENOUS | Status: AC
  Administered 2019-08-14 (×3): 10 meq via INTRAVENOUS

## 2019-08-14 MED ORDER — LACTATED RINGERS IV SOLN
500.0000 mL | Freq: Once | INTRAVENOUS | Status: AC | PRN
  Administered 2019-08-14: 500 mL via INTRAVENOUS

## 2019-08-14 MED ORDER — SUCCINYLCHOLINE CHLORIDE 200 MG/10ML IV SOSY
PREFILLED_SYRINGE | INTRAVENOUS | Status: DC | PRN
  Administered 2019-08-14: 140 mg via INTRAVENOUS

## 2019-08-14 MED ORDER — TRAMADOL HCL 50 MG PO TABS
50.0000 mg | ORAL_TABLET | ORAL | Status: DC | PRN
  Administered 2019-08-15 (×3): 100 mg via ORAL
  Administered 2019-08-17 – 2019-08-18 (×2): 50 mg via ORAL
  Filled 2019-08-14 (×2): qty 1
  Filled 2019-08-14 (×2): qty 2
  Filled 2019-08-14: qty 1
  Filled 2019-08-14: qty 2

## 2019-08-14 MED ORDER — EPHEDRINE 5 MG/ML INJ
INTRAVENOUS | Status: AC
  Filled 2019-08-14: qty 10

## 2019-08-14 MED ORDER — ARTIFICIAL TEARS OPHTHALMIC OINT
TOPICAL_OINTMENT | OPHTHALMIC | Status: DC | PRN
  Administered 2019-08-14: 1 via OPHTHALMIC

## 2019-08-14 MED ORDER — MIDAZOLAM HCL 2 MG/2ML IJ SOLN: 2.0000 mg | INTRAMUSCULAR | Status: DC | PRN

## 2019-08-14 MED ORDER — INSULIN REGULAR BOLUS VIA INFUSION
0.0000 [IU] | Freq: Three times a day (TID) | INTRAVENOUS | Status: DC
  Filled 2019-08-14: qty 10

## 2019-08-14 MED ORDER — MIDAZOLAM HCL 5 MG/5ML IJ SOLN
INTRAMUSCULAR | Status: DC | PRN
  Administered 2019-08-14 (×2): 2 mg via INTRAVENOUS
  Administered 2019-08-14: 1 mg via INTRAVENOUS

## 2019-08-14 MED ORDER — MAGNESIUM SULFATE 4 GM/100ML IV SOLN
4.0000 g | Freq: Once | INTRAVENOUS | Status: AC
  Administered 2019-08-14: 4 g via INTRAVENOUS
  Filled 2019-08-14: qty 100

## 2019-08-14 MED ORDER — OXYCODONE HCL 5 MG PO TABS
5.0000 mg | ORAL_TABLET | ORAL | Status: DC | PRN
  Administered 2019-08-14 – 2019-08-15 (×2): 10 mg via ORAL
  Administered 2019-08-15 (×2): 5 mg via ORAL
  Filled 2019-08-14: qty 1
  Filled 2019-08-14: qty 2
  Filled 2019-08-14: qty 1
  Filled 2019-08-14: qty 2

## 2019-08-14 MED ORDER — ACETAMINOPHEN 160 MG/5ML PO SOLN: 650.0000 mg | Freq: Once | ORAL | Status: AC

## 2019-08-14 MED ORDER — ONDANSETRON HCL 4 MG/2ML IJ SOLN: 4.0000 mg | Freq: Four times a day (QID) | INTRAMUSCULAR | Status: DC | PRN

## 2019-08-14 MED ORDER — ASPIRIN EC 325 MG PO TBEC
325.0000 mg | DELAYED_RELEASE_TABLET | Freq: Every day | ORAL | Status: DC
  Administered 2019-08-15 – 2019-08-19 (×5): 325 mg via ORAL
  Filled 2019-08-14 (×5): qty 1

## 2019-08-14 MED ORDER — SODIUM CHLORIDE 0.45 % IV SOLN
INTRAVENOUS | Status: DC | PRN
  Administered 2019-08-14: 13:00:00 via INTRAVENOUS

## 2019-08-14 MED ORDER — POTASSIUM CHLORIDE 10 MEQ/50ML IV SOLN
10.0000 meq | INTRAVENOUS | Status: AC
  Administered 2019-08-14 – 2019-08-15 (×3): 10 meq via INTRAVENOUS
  Filled 2019-08-14 (×3): qty 50

## 2019-08-14 MED ORDER — SODIUM CHLORIDE 0.9 % IV SOLN
1.5000 g | Freq: Two times a day (BID) | INTRAVENOUS | Status: AC
  Administered 2019-08-14 – 2019-08-16 (×4): 1.5 g via INTRAVENOUS
  Filled 2019-08-14 (×4): qty 1.5

## 2019-08-14 MED ORDER — PHENYLEPHRINE 40 MCG/ML (10ML) SYRINGE FOR IV PUSH (FOR BLOOD PRESSURE SUPPORT)
PREFILLED_SYRINGE | INTRAVENOUS | Status: AC
  Filled 2019-08-14: qty 10

## 2019-08-14 MED ORDER — PROTAMINE SULFATE 10 MG/ML IV SOLN
INTRAVENOUS | Status: DC | PRN
  Administered 2019-08-14: 10 mg via INTRAVENOUS
  Administered 2019-08-14: 260 mg via INTRAVENOUS

## 2019-08-14 MED ORDER — BISACODYL 5 MG PO TBEC
10.0000 mg | DELAYED_RELEASE_TABLET | Freq: Every day | ORAL | Status: DC
  Administered 2019-08-15 – 2019-08-18 (×4): 10 mg via ORAL
  Filled 2019-08-14 (×5): qty 2

## 2019-08-14 MED ORDER — CHLORHEXIDINE GLUCONATE CLOTH 2 % EX PADS
6.0000 | MEDICATED_PAD | Freq: Every day | CUTANEOUS | Status: DC
  Administered 2019-08-14 – 2019-08-16 (×2): 6 via TOPICAL

## 2019-08-14 MED ORDER — SODIUM CHLORIDE 0.9 % IV SOLN: 250.0000 mL | INTRAVENOUS | Status: DC

## 2019-08-14 SURGICAL SUPPLY — 97 items
ADAPTER CARDIO PERF ANTE/RETRO (ADAPTER) ×3 IMPLANT
BAG DECANTER FOR FLEXI CONT (MISCELLANEOUS) ×6 IMPLANT
BASKET HEART (ORDER IN 25'S) (MISCELLANEOUS) ×1
BASKET HEART (ORDER IN 25S) (MISCELLANEOUS) ×2 IMPLANT
BLADE CLIPPER SURG (BLADE) ×3 IMPLANT
BLADE STERNUM SYSTEM 6 (BLADE) ×3 IMPLANT
BNDG ELASTIC 4X5.8 VLCR STR LF (GAUZE/BANDAGES/DRESSINGS) ×6 IMPLANT
BNDG ELASTIC 6X5.8 VLCR STR LF (GAUZE/BANDAGES/DRESSINGS) ×6 IMPLANT
BNDG GAUZE ELAST 4 BULKY (GAUZE/BANDAGES/DRESSINGS) ×3 IMPLANT
CANISTER SUCT 3000ML PPV (MISCELLANEOUS) ×3 IMPLANT
CATH CPB KIT HENDRICKSON (MISCELLANEOUS) ×3 IMPLANT
CATH ROBINSON RED A/P 18FR (CATHETERS) ×9 IMPLANT
CLIP RETRACTION 3.0MM CORONARY (MISCELLANEOUS) ×3 IMPLANT
CONN ST 1/4X3/8  BEN (MISCELLANEOUS) ×4
CONN ST 1/4X3/8 BEN (MISCELLANEOUS) ×8 IMPLANT
COVER WAND RF STERILE (DRAPES) IMPLANT
DERMABOND ADVANCED (GAUZE/BANDAGES/DRESSINGS) ×1
DERMABOND ADVANCED .7 DNX12 (GAUZE/BANDAGES/DRESSINGS) ×2 IMPLANT
DRAIN CHANNEL 28F RND 3/8 FF (WOUND CARE) ×12 IMPLANT
DRAPE CARDIOVASCULAR INCISE (DRAPES) ×1
DRAPE SLUSH/WARMER DISC (DRAPES) ×3 IMPLANT
DRAPE SRG 135X102X78XABS (DRAPES) ×2 IMPLANT
DRSG AQUACEL AG ADV 3.5X14 (GAUZE/BANDAGES/DRESSINGS) ×3 IMPLANT
ELECT BLADE 4.0 EZ CLEAN MEGAD (MISCELLANEOUS) ×3
ELECT CAUTERY BLADE 6.4 (BLADE) ×3 IMPLANT
ELECT REM PT RETURN 9FT ADLT (ELECTROSURGICAL) ×6
ELECTRODE BLDE 4.0 EZ CLN MEGD (MISCELLANEOUS) ×2 IMPLANT
ELECTRODE REM PT RTRN 9FT ADLT (ELECTROSURGICAL) ×4 IMPLANT
FELT TEFLON 1X6 (MISCELLANEOUS) ×3 IMPLANT
GAUZE SPONGE 4X4 12PLY STRL (GAUZE/BANDAGES/DRESSINGS) ×6 IMPLANT
GAUZE SPONGE 4X4 12PLY STRL LF (GAUZE/BANDAGES/DRESSINGS) ×6 IMPLANT
GLOVE BIO SURGEON STRL SZ 6 (GLOVE) ×6 IMPLANT
GLOVE BIO SURGEON STRL SZ 6.5 (GLOVE) ×9 IMPLANT
GLOVE BIO SURGEON STRL SZ7.5 (GLOVE) ×9 IMPLANT
GLOVE BIOGEL PI IND STRL 6 (GLOVE) ×4 IMPLANT
GLOVE BIOGEL PI IND STRL 6.5 (GLOVE) ×2 IMPLANT
GLOVE BIOGEL PI IND STRL 7.5 (GLOVE) ×6 IMPLANT
GLOVE BIOGEL PI INDICATOR 6 (GLOVE) ×2
GLOVE BIOGEL PI INDICATOR 6.5 (GLOVE) ×1
GLOVE BIOGEL PI INDICATOR 7.5 (GLOVE) ×3
GLOVE NEODERM STRL 7.5 LF PF (GLOVE) ×6 IMPLANT
GLOVE SURG NEODERM 7.5  LF PF (GLOVE) ×3
GOWN STRL REUS W/ TWL LRG LVL3 (GOWN DISPOSABLE) ×20 IMPLANT
GOWN STRL REUS W/TWL LRG LVL3 (GOWN DISPOSABLE) ×10
HEMOSTAT POWDER SURGIFOAM 1G (HEMOSTASIS) ×9 IMPLANT
HEMOSTAT SURGICEL 2X14 (HEMOSTASIS) IMPLANT
KIT BASIN OR (CUSTOM PROCEDURE TRAY) ×3 IMPLANT
KIT SUCTION CATH 14FR (SUCTIONS) ×3 IMPLANT
KIT TURNOVER KIT B (KITS) ×3 IMPLANT
KIT VASOVIEW HEMOPRO 2 VH 4000 (KITS) ×3 IMPLANT
LEAD PACING MYOCARDI (MISCELLANEOUS) IMPLANT
MARKER GRAFT CORONARY BYPASS (MISCELLANEOUS) ×9 IMPLANT
NS IRRIG 1000ML POUR BTL (IV SOLUTION) ×15 IMPLANT
PACK E OPEN HEART (SUTURE) ×3 IMPLANT
PACK OPEN HEART (CUSTOM PROCEDURE TRAY) ×3 IMPLANT
PACK SPY-PHI (KITS) ×3 IMPLANT
PAD ARMBOARD 7.5X6 YLW CONV (MISCELLANEOUS) ×6 IMPLANT
PAD ELECT DEFIB RADIOL ZOLL (MISCELLANEOUS) ×3 IMPLANT
PENCIL BUTTON HOLSTER BLD 10FT (ELECTRODE) ×3 IMPLANT
POSITIONER HEAD DONUT 9IN (MISCELLANEOUS) ×3 IMPLANT
POWDER SURGICEL 3.0 GRAM (HEMOSTASIS) ×3 IMPLANT
PUNCH AORTIC ROTATE 4.0MM (MISCELLANEOUS) ×3 IMPLANT
SEALANT SURG COSEAL 4ML (VASCULAR PRODUCTS) ×3 IMPLANT
SET CARDIOPLEGIA MPS 5001102 (MISCELLANEOUS) ×3 IMPLANT
SUT BONE WAX W31G (SUTURE) ×3 IMPLANT
SUT ETHIBOND X763 2 0 SH 1 (SUTURE) ×6 IMPLANT
SUT MNCRL AB 3-0 PS2 18 (SUTURE) ×6 IMPLANT
SUT PDS AB 1 CTX 36 (SUTURE) ×6 IMPLANT
SUT PROLENE 3 0 SH DA (SUTURE) ×3 IMPLANT
SUT PROLENE 4 0 RB 1 (SUTURE) ×1
SUT PROLENE 4-0 RB1 .5 CRCL 36 (SUTURE) ×2 IMPLANT
SUT PROLENE 5 0 C 1 36 (SUTURE) IMPLANT
SUT PROLENE 6 0 C 1 30 (SUTURE) ×9 IMPLANT
SUT PROLENE 8 0 BV175 6 (SUTURE) ×9 IMPLANT
SUT PROLENE BLUE 7 0 (SUTURE) ×3 IMPLANT
SUT SILK  1 MH (SUTURE) ×2
SUT SILK 1 MH (SUTURE) ×4 IMPLANT
SUT SILK 2 0 SH CR/8 (SUTURE) ×6 IMPLANT
SUT SILK 3 0 SH CR/8 (SUTURE) ×3 IMPLANT
SUT STEEL 6MS V (SUTURE) ×3 IMPLANT
SUT STEEL SZ 6 DBL 3X14 BALL (SUTURE) ×3 IMPLANT
SUT VIC AB 2-0 CT1 27 (SUTURE) ×1
SUT VIC AB 2-0 CT1 TAPERPNT 27 (SUTURE) ×2 IMPLANT
SUT VIC AB 2-0 CTX 27 (SUTURE) IMPLANT
SUT VIC AB 3-0 X1 27 (SUTURE) ×3 IMPLANT
SYR 10ML LL (SYRINGE) IMPLANT
SYSTEM SAHARA CHEST DRAIN ATS (WOUND CARE) ×3 IMPLANT
TAPE CLOTH SURG 4X10 WHT LF (GAUZE/BANDAGES/DRESSINGS) ×6 IMPLANT
TAPE PAPER 2X10 WHT MICROPORE (GAUZE/BANDAGES/DRESSINGS) ×3 IMPLANT
TOWEL GREEN STERILE (TOWEL DISPOSABLE) ×3 IMPLANT
TOWEL GREEN STERILE FF (TOWEL DISPOSABLE) IMPLANT
TRAY FOLEY SLVR 16FR TEMP STAT (SET/KITS/TRAYS/PACK) ×3 IMPLANT
TUBING ART PRESS 48 MALE/FEM (TUBING) ×6 IMPLANT
TUBING LAP HI FLOW INSUFFLATIO (TUBING) ×3 IMPLANT
UNDERPAD 30X30 (UNDERPADS AND DIAPERS) ×3 IMPLANT
WATER STERILE IRR 1000ML POUR (IV SOLUTION) ×6 IMPLANT
WATER STERILE IRR 1000ML UROMA (IV SOLUTION) ×3 IMPLANT

## 2019-08-14 NOTE — Anesthesia Procedure Notes (Signed)
Procedure Name: Intubation Date/Time: 08/14/2019 7:45 AM Performed by: Clearnce Sorrel, CRNA Pre-anesthesia Checklist: Patient identified, Emergency Drugs available, Suction available and Patient being monitored Patient Re-evaluated:Patient Re-evaluated prior to induction Oxygen Delivery Method: Circle System Utilized Preoxygenation: Pre-oxygenation with 100% oxygen Induction Type: IV induction Ventilation: Mask ventilation without difficulty and Oral airway inserted - appropriate to patient size Laryngoscope Size: Sabra Heck and 2 Grade View: Grade I Tube type: Oral Tube size: 8.0 mm Number of attempts: 1 Airway Equipment and Method: Stylet and Oral airway Placement Confirmation: ETT inserted through vocal cords under direct vision,  positive ETCO2 and breath sounds checked- equal and bilateral Secured at: 23 cm Tube secured with: Tape Dental Injury: Teeth and Oropharynx as per pre-operative assessment  Comments: Performed by Reece Agar, SRNA

## 2019-08-14 NOTE — Brief Op Note (Signed)
08/12/2019 - 08/14/2019  9:02 AM  PATIENT:  Timothy [name]  78 y.o. male  PRE-OPERATIVE DIAGNOSIS:  Coronary Artery Disease  POST-OPERATIVE DIAGNOSIS:  Coronary Artery Disease  PROCEDURE:  Procedure(s): CORONARY ARTERY BYPASS GRAFTING (CABG) endoscopic right saphenous vein harvesting to Diag1 and Om1; LIMA to (m)LAD; RIMA to PDA. (N/A) TRANSESOPHAGEAL ECHOCARDIOGRAM (TEE) (N/A) LIMA-LAD RIMA-RPD SVG-DIAG SVG-OM  SURGEON:  Surgeon(s) and Role:    * Wonda Olds, MD - Primary  PHYSICIAN ASSISTANT: Shamirah Ivan PA-C  ANESTHESIA:   general  EBL:  700 mL   BLOOD ADMINISTERED:none  DRAINS: 2 PLEURAL AND 1 MEDIASTINAL CHEST TUBES   LOCAL MEDICATIONS USED:  NONE  SPECIMEN:  No Specimen  DISPOSITION OF SPECIMEN:  N/A  COUNTS:  YES  TOURNIQUET:  * No tourniquets in log *  DICTATION: .Dragon Dictation  PLAN OF CARE: Admit to inpatient   PATIENT DISPOSITION:  ICU - intubated and hemodynamically stable.   Delay start of Pharmacological VTE agent (>24hrs) due to surgical blood loss or risk of bleeding: yes  COMPLICATIONS: NO KNOWN

## 2019-08-14 NOTE — Anesthesia Procedure Notes (Signed)
Central Venous Catheter Insertion Performed by: Duane Boston, MD, anesthesiologist Start/End9/24/2020 6:38 AM, 08/14/2019 6:48 AM Patient location: Pre-op. Preanesthetic checklist: patient identified, IV checked, site marked, risks and benefits discussed, surgical consent, monitors and equipment checked, pre-op evaluation, timeout performed and anesthesia consent Position: Trendelenburg Lidocaine 1% used for infiltration and patient sedated Hand hygiene performed , maximum sterile barriers used  and Seldinger technique used Catheter size: 8.5 Fr Total catheter length 8. PA cath was placed.Sheath introducer Swan type:thermodilution PA Cath depth:50 Procedure performed using ultrasound guided technique. Ultrasound Notes:anatomy identified, needle tip was noted to be adjacent to the nerve/plexus identified, no ultrasound evidence of intravascular and/or intraneural injection and image(s) printed for medical record Attempts: 1 Following insertion, line sutured, dressing applied and Biopatch. Post procedure assessment: free fluid flow, blood return through all ports and no air  Patient tolerated the procedure well with no immediate complications.

## 2019-08-14 NOTE — Anesthesia Procedure Notes (Signed)
Arterial Line Insertion Start/End9/24/2020 6:50 AM, 08/14/2019 7:05 AM Performed by: Clearnce Sorrel, CRNA, CRNA  Preanesthetic checklist: patient identified, IV checked, site marked, risks and benefits discussed, surgical consent, monitors and equipment checked, pre-op evaluation, timeout performed and anesthesia consent Lidocaine 1% used for infiltration Left, radial was placed Catheter size: 20 G Hand hygiene performed  and maximum sterile barriers used  Allen's test indicative of satisfactory collateral circulation Attempts: 2 Procedure performed without using ultrasound guided technique. Following insertion, Biopatch and dressing applied. Post procedure assessment: normal  Patient tolerated the procedure well with no immediate complications.

## 2019-08-14 NOTE — Plan of Care (Signed)

## 2019-08-14 NOTE — Op Note (Signed)
CARDIOTHORACIC SURGERY OPERATIVE NOTE  Date of Procedure: 08/14/2019  Preoperative Diagnosis: Severe 3-vessel Coronary Artery Disease including LM CAD; progressive angina  Postoperative Diagnosis: Same  Procedure:    Coronary Artery Bypass Grafting x 4  Left Internal Mammary Artery to Distal Left Anterior Descending Coronary Artery; pedicled RIMA to  Posterior Descending Coronary Artery; Saphenous Vein Graft to  Obtuse Marginal Branch of Left Circumflex Coronary Artery, Sapheonous Vein Graft to  Diagonal Branch Coronary Artery, Endoscopic Vein Harvest from right Thigh; bilateral IMA harvesting; completion angiography with indocyanine green fluorescence imaging (SPY)  Surgeon: B. Murvin Natal, MD  Assistant: Jadene Pierini PA-C  Anesthesia: GET  Operative Findings:  Preserved left ventricular systolic function  good quality  internal mammary artery conduits  fair quality saphenous vein conduit  goodquality target vessels for grafting    BRIEF CLINICAL NOTE AND INDICATIONS FOR SURGERY  78 yo man with progressive angina underwent LHC demonstrating LM CAD. Admitted to [facility], referred for CABG. Presents today for surgery.    DETAILS OF THE OPERATIVE PROCEDURE  Preparation:  The patient is brought to the operating room on the above mentioned date and central monitoring was established by the anesthesia team including placement of Swan-Ganz catheter and radial arterial line. The patient is placed in the supine position on the operating table.  Intravenous antibiotics are administered. General endotracheal anesthesia is induced uneventfully. A Foley catheter is placed.  Baseline transesophageal echocardiogram was performed.  Findings were notable for preserved bi-V function  The patient's chest, abdomen, both groins, and both lower extremities are prepared and draped in a sterile manner. A time out procedure is performed.   Surgical Approach and Conduit Harvest:  A median  sternotomy incision was performed and the left internal mammary artery is dissected from the chest wall and prepared for bypass grafting. The left internal mammary artery is notably good quality conduit. Simultaneously, the greater saphenous vein is obtained from the patient's right thight using endoscopic vein harvest technique. The saphenous vein is notably fair quality conduit due to relatively small size. After removal of the saphenous vein, the small surgical incisions in the lower extremity are closed with absorbable suture. Next, the [place] was harvested from the chest wall.  Following systemic heparinization, the right and  left internal mammary artery were transected distally noted to have excellent flow. They are treated with papaverine.   Extracorporeal Cardiopulmonary Bypass and Myocardial Protection:  The pericardium is opened. The ascending aorta is nondisease in appearance. The ascending aorta and the right atrium are cannulated for cardiopulmonary bypass.  Adequate heparinization is verified. The entire pre-bypass portion of the operation was notable for stable hemodynamics.  Cardiopulmonary bypass was begun and the surface of the heart is inspected. Distal target vessels are selected for coronary artery bypass grafting. A cardioplegia cannula is placed in the ascending aorta.   The patient is allowed to cool passively to 34 C systemic temperature.  The aortic cross clamp is applied and cold blood cardioplegia is delivered initially in an antegrade fashion through the aortic root.   Iced saline slush is applied for topical hypothermia.  The initial cardioplegic arrest is rapid with early diastolic arrest.  Repeat doses of cardioplegia are administered intermittently throughout the entire cross clamp portion of the operation through the aortic root,  and through subsequently placed vein grafts in order to maintain completely flat electrocardiogram. Myocardial protection was felt to be  excellent.   Coronary Artery Bypass Grafting:   The diagonal branch of the  left anterior descending coronary artery was grafted using a reversed saphenous vein graft in an end-to-side fashion.  At the site of distal anastomosis the target vessel was good quality and measured approximately 2 mm in diameter. Anastomotic patency and runoff was confirmed with indocyanine green fluorescence imaging (SPY).  The  obtuse marginal branch of the left circumflex coronary artery was grafted using a reversed saphenous vein graft in an end-to-side fashion.  At the site of distal anastomosis the target vessel was good quality and measured approximately 1.102mm in diameter. Anastomotic patency and runoff was confirmed with indocyanine green fluorescence imaging (SPY).  The distal left anterior coronary artery was grafted with the left internal mammary artery in an end-to-side fashion.  At the site of distal anastomosis the target vessel was good quality and measured approximately 1.5 mm in diameter .Anastomotic patency and runoff was confirmed with indocyanine green fluorescence imaging (SPY).  The  posterior descending branch of the right coronary artery was grafted using a reversed saphenous vein graft in an end-to-side fashion.  At the site of distal anastomosis the target vessel was good quality and measured approximately 1.5 mm in diameter. Anastomotic patency and runoff was confirmed with indocyanine green fluorescence imaging (SPY).  All proximal vein graft anastomoses were placed directly to the ascending aorta prior to removal of the aortic cross clamp. Anastomotic patency and runoff was confirmed with indocyanine green fluorescence imaging (SPY).   The aortic cross clamp was removed after a total cross clamp time of 89 minutes.   Procedure Completion:  All proximal and distal coronary anastomoses were inspected for hemostasis and appropriate graft orientation. Epicardial pacing wires are fixed to the  right ventricular outflow tract and to the right atrial appendage. The patient is rewarmed to 37C temperature. The patient is weaned and disconnected from cardiopulmonary bypass.  The patient's rhythm at separation from bypass was sinus bradycardia.  The patient was weaned from cardiopulmonary bypass  without any inotropic support. Total cardiopulmonary bypass time for the operation was 110 minutes.  Followup transesophageal echocardiogram performed after separation from bypass revealed  no changes from the preoperative exam.  The aortic and venous cannula were removed uneventfully. Protamine was administered to reverse the anticoagulation. The mediastinum and pleural space were inspected for hemostasis and irrigated with saline solution. The mediastinum and bilateral pleural space were drained using fluted chest tubes placed through separate stab incisions inferiorly.  The soft tissues anterior to the aorta were reapproximated loosely. The sternum is closed with double strength sternal wire. The soft tissues anterior to the sternum were closed in multiple layers and the skin is closed with a running subcuticular skin closure. The post-bypass portion of the operation was notable for stable rhythm and hemodynamics.  No blood products were administered during the operation.   Disposition:  The patient tolerated the procedure well and is transported to the surgical intensive care in stable condition. There are no intraoperative complications. All sponge instrument and needle counts are verified correct at completion of the operation.    Jayme Cloud, MD 08/14/2019 12:50 PM

## 2019-08-14 NOTE — Progress Notes (Signed)
  Echocardiogram Echocardiogram Transesophageal has been performed.  Timothy [name] 08/14/2019, 8:13 AM

## 2019-08-14 NOTE — Transfer of Care (Signed)
Immediate Anesthesia Transfer of Care Note  Patient: Timothy [name]  Procedure(s) Performed: CORONARY ARTERY BYPASS GRAFTING (CABG) endoscopic right saphenous vein harvesting to Diag1 and Om1; LIMA to (m)LAD; RIMA to PDA. (N/A Chest) TRANSESOPHAGEAL ECHOCARDIOGRAM (TEE) (N/A )  Patient Location: SICU  Anesthesia Type:General  Level of Consciousness: Patient remains intubated per anesthesia plan  Airway & Oxygen Therapy: Patient remains intubated per anesthesia plan and Patient placed on Ventilator (see vital sign flow sheet for setting)  Post-op Assessment: Report given to RN and Post -op Vital signs reviewed and stable  Post vital signs: Reviewed and stable  Last Vitals:  Vitals Value Taken Time  BP    Temp    Pulse 72 08/14/19 1321  Resp 12 08/14/19 1321  SpO2 100 % 08/14/19 1321  Vitals shown include unvalidated device data.  Last Pain:  Vitals:   08/14/19 0525  TempSrc: Oral  PainSc:       Patients Stated Pain Goal: 0 ([id number])  Complications: No apparent anesthesia complications

## 2019-08-14 NOTE — Progress Notes (Signed)
Patient ID: Timothy [name], male   DOB: 1941/05/12, 78 y.o.   MRN: [id number]  TCTS Evening Rounds:   Hemodynamically stable  CI = 2.7  Asleep on vent  Urine output good  CT output low  CBC    Component Value Date/Time   WBC 14.3 (H) 08/14/2019 1326   RBC 3.53 (L) 08/14/2019 1326   HGB 9.8 (L) 08/14/2019 1326   HGB 12.3 (L) 08/04/2019 1414   HCT 30.0 (L) 08/14/2019 1326   HCT 36.5 (L) 08/04/2019 1414   PLT 120 (L) 08/14/2019 1326   PLT 201 08/04/2019 1414   MCV 85.0 08/14/2019 1326   MCV 82 08/04/2019 1414   MCH 27.8 08/14/2019 1326   MCHC 32.7 08/14/2019 1326   RDW 13.5 08/14/2019 1326   RDW 13.9 08/04/2019 1414   LYMPHSABS 1.8 01/31/2009 1050   MONOABS 0.5 01/31/2009 1050   EOSABS 0.1 01/31/2009 1050   BASOSABS 0.1 01/31/2009 1050     BMET    Component Value Date/Time   NA 140 08/14/2019 1208   NA 144 08/04/2019 1414   K 3.6 08/14/2019 1208   CL 102 08/14/2019 1208   CO2 24 08/14/2019 0531   GLUCOSE 133 (H) 08/14/2019 1208   BUN 19 08/14/2019 1208   BUN 21 08/04/2019 1414   CREATININE 1.00 08/14/2019 1208   CALCIUM 8.9 08/14/2019 0531   GFRNONAA 54 (L) 08/14/2019 0531   GFRAA >60 08/14/2019 0531     A/P:  Stable postop course. Continue current plans

## 2019-08-14 NOTE — Procedures (Signed)
Extubation Procedure Note  Patient Details:   Name: Timothy [name] DOB: 07/20/1941 MRN: [id number]   Airway Documentation:    Vent end date: 08/14/19 Vent end time: 1850   Evaluation  O2 sats: stable throughout Complications: No apparent complications Patient did tolerate procedure well. Bilateral Breath Sounds: Clear, Diminished   Yes   Positive cuff leak noted. NIF - 20, VC 770. Patient placed on [place] 4 L with humidity, no stridor noted. Patient able to reach 375 mL using the incentive spirometer.  Mingo Amber Ronda Kazmi 08/14/2019, 7:24 PM

## 2019-08-14 NOTE — Brief Op Note (Signed)
08/12/2019 - 08/14/2019  12:48 PM  PATIENT:  Timothy [name]  78 y.o. male  PRE-OPERATIVE DIAGNOSIS:  Coronary Artery Disease  POST-OPERATIVE DIAGNOSIS:  Coronary Artery Disease  PROCEDURE:  Procedure(s): CORONARY ARTERY BYPASS GRAFTING (CABG) endoscopic right saphenous vein harvesting to Diag1 and Om1; LIMA to (m)LAD; RIMA to PDA. (N/A) TRANSESOPHAGEAL ECHOCARDIOGRAM (TEE) (N/A)  Indocyanine green fluorescence imaging for graft surveillance (SPY)  SURGEON:  Surgeon(s) and Role:    * Wonda Olds, MD - Primary  PHYSICIAN ASSISTANT: Jadene Pierini PA-C  ASSISTANTS: staff   ANESTHESIA:   general  EBL: per anes  BLOOD ADMINISTERED:400 CC CELLSAVER  DRAINS: 3 Chest Tube(s) in the mediastinum and bilateral pleural spaces   LOCAL MEDICATIONS USED:  NONE  SPECIMEN:  No Specimen  DISPOSITION OF SPECIMEN:  N/A  COUNTS:  YES  TOURNIQUET:  * No tourniquets in log *  DICTATION: .Note written in EPIC  PLAN OF CARE: Admit to inpatient   PATIENT DISPOSITION:  ICU - intubated and hemodynamically stable.   Delay start of Pharmacological VTE agent (>24hrs) due to surgical blood loss or risk of bleeding: yes  Timothy [name] Timothy [name], [name]

## 2019-08-14 NOTE — Anesthesia Postprocedure Evaluation (Signed)
Anesthesia Post Note  Patient: Timothy [name]  Procedure(s) Performed: CORONARY ARTERY BYPASS GRAFTING (CABG) endoscopic right saphenous vein harvesting to Diag1 and Om1; LIMA to (m)LAD; RIMA to PDA. (N/A Chest) TRANSESOPHAGEAL ECHOCARDIOGRAM (TEE) (N/A )     Patient location during evaluation: SICU Anesthesia Type: General Level of consciousness: sedated Pain management: pain level controlled Vital Signs Assessment: post-procedure vital signs reviewed and stable Respiratory status: patient remains intubated per anesthesia plan Cardiovascular status: stable Postop Assessment: no apparent nausea or vomiting Anesthetic complications: no    Last Vitals:  Vitals:   08/14/19 1445 08/14/19 1500  BP:  90/66  Pulse: 92 92  Resp: 12 12  Temp: (!) 35.2 C (!) 35.3 C  SpO2: 100% 100%    Last Pain:  Vitals:   08/14/19 0525  TempSrc: Oral  PainSc:                  Timothy [name]

## 2019-08-14 NOTE — OR Nursing (Signed)
Spy-Phi kit used by Dr. Sammuel Cooper, MD, which contained indocyanine green 25 mg/32ms (exp: 04/2022); Sterile water 259m(exp: 09/20/2021 and 12/21/2021; Sterile water 100080m(exp: 06/2020) placing 30 mL on the sterile field to mix with 10 mL of heparinized blood.

## 2019-08-15 ENCOUNTER — Inpatient Hospital Stay (HOSPITAL_COMMUNITY): Payer: HMO

## 2019-08-15 ENCOUNTER — Other Ambulatory Visit: Payer: Self-pay

## 2019-08-15 ENCOUNTER — Encounter (HOSPITAL_COMMUNITY): Payer: Self-pay | Admitting: Cardiothoracic Surgery

## 2019-08-15 LAB — CBC
HCT: 27.7 % — ABNORMAL LOW (ref 39.0–52.0)
HCT: 24.6 % — ABNORMAL LOW (ref 39.0–52.0)
Hemoglobin: 9 g/dL — ABNORMAL LOW (ref 13.0–17.0)
Hemoglobin: 8 g/dL — ABNORMAL LOW (ref 13.0–17.0)
MCH: 28.8 pg (ref 26.0–34.0)
MCH: 29 pg (ref 26.0–34.0)
MCHC: 32.5 g/dL (ref 30.0–36.0)
MCHC: 32.5 g/dL (ref 30.0–36.0)
MCV: 88.5 fL (ref 80.0–100.0)
MCV: 89.1 fL (ref 80.0–100.0)
Platelets: 122 10*3/uL — ABNORMAL LOW (ref 150–400)
Platelets: 108 10*3/uL — ABNORMAL LOW (ref 150–400)
RBC: 3.13 MIL/uL — ABNORMAL LOW (ref 4.22–5.81)
RBC: 2.76 MIL/uL — ABNORMAL LOW (ref 4.22–5.81)
RDW: 13.8 % (ref 11.5–15.5)
RDW: 14.3 % (ref 11.5–15.5)
WBC: 15.5 10*3/uL — ABNORMAL HIGH (ref 4.0–10.5)
WBC: 10.4 10*3/uL (ref 4.0–10.5)
nRBC: 0 % (ref 0.0–0.2)
nRBC: 0 % (ref 0.0–0.2)

## 2019-08-15 LAB — BASIC METABOLIC PANEL
Anion gap: 6 (ref 5–15)
Anion gap: 10 (ref 5–15)
BUN: 16 mg/dL (ref 8–23)
BUN: 21 mg/dL (ref 8–23)
CO2: 24 mmol/L (ref 22–32)
CO2: 21 mmol/L — ABNORMAL LOW (ref 22–32)
Calcium: 7.8 mg/dL — ABNORMAL LOW (ref 8.9–10.3)
Calcium: 7.7 mg/dL — ABNORMAL LOW (ref 8.9–10.3)
Chloride: 110 mmol/L (ref 98–111)
Chloride: 106 mmol/L (ref 98–111)
Creatinine, Ser: 1.11 mg/dL (ref 0.61–1.24)
Creatinine, Ser: 1.35 mg/dL — ABNORMAL HIGH (ref 0.61–1.24)
GFR calc Af Amer: >60 mL/min (ref >60)
GFR calc Af Amer: 58 mL/min — ABNORMAL LOW (ref >60)
GFR calc non Af Amer: >60 mL/min (ref >60)
GFR calc non Af Amer: 50 mL/min — ABNORMAL LOW (ref >60)
Glucose, Bld: 107 mg/dL — ABNORMAL HIGH (ref 70–99)
Glucose, Bld: 128 mg/dL — ABNORMAL HIGH (ref 70–99)
Potassium: 4.6 mmol/L (ref 3.5–5.1)
Potassium: 4 mmol/L (ref 3.5–5.1)
Sodium: 140 mmol/L (ref 135–145)
Sodium: 137 mmol/L (ref 135–145)

## 2019-08-15 LAB — GLUCOSE, CAPILLARY
Glucose-Capillary: 101 mg/dL — ABNORMAL HIGH (ref 70–99)
Glucose-Capillary: 107 mg/dL — ABNORMAL HIGH (ref 70–99)
Glucose-Capillary: 107 mg/dL — ABNORMAL HIGH (ref 70–99)
Glucose-Capillary: 109 mg/dL — ABNORMAL HIGH (ref 70–99)
Glucose-Capillary: 110 mg/dL — ABNORMAL HIGH (ref 70–99)
Glucose-Capillary: 111 mg/dL — ABNORMAL HIGH (ref 70–99)
Glucose-Capillary: 117 mg/dL — ABNORMAL HIGH (ref 70–99)
Glucose-Capillary: 128 mg/dL — ABNORMAL HIGH (ref 70–99)
Glucose-Capillary: 129 mg/dL — ABNORMAL HIGH (ref 70–99)
Glucose-Capillary: 131 mg/dL — ABNORMAL HIGH (ref 70–99)
Glucose-Capillary: 132 mg/dL — ABNORMAL HIGH (ref 70–99)
Glucose-Capillary: 78 mg/dL (ref 70–99)
Glucose-Capillary: 94 mg/dL (ref 70–99)

## 2019-08-15 LAB — MAGNESIUM
Magnesium: 2.5 mg/dL — ABNORMAL HIGH (ref 1.7–2.4)
Magnesium: 2.2 mg/dL (ref 1.7–2.4)

## 2019-08-15 MED ORDER — FE FUMARATE-B12-VIT C-FA-IFC PO CAPS
1.0000 | ORAL_CAPSULE | Freq: Two times a day (BID) | ORAL | Status: DC
  Administered 2019-08-15 – 2019-08-19 (×9): 1 via ORAL
  Filled 2019-08-15 (×9): qty 1

## 2019-08-15 MED ORDER — INSULIN ASPART 100 UNIT/ML ~~LOC~~ SOLN
0.0000 [IU] | SUBCUTANEOUS | Status: DC
  Administered 2019-08-15 – 2019-08-16 (×4): 2 [IU] via SUBCUTANEOUS

## 2019-08-15 MED ORDER — MAGNESIUM OXIDE 400 (241.3 MG) MG PO TABS
400.0000 mg | ORAL_TABLET | Freq: Two times a day (BID) | ORAL | Status: DC
  Administered 2019-08-15 – 2019-08-19 (×9): 400 mg via ORAL
  Filled 2019-08-15 (×9): qty 1

## 2019-08-15 MED ORDER — COLCHICINE 0.6 MG PO TABS
0.6000 mg | ORAL_TABLET | Freq: Every day | ORAL | Status: DC
  Administered 2019-08-15 – 2019-08-19 (×5): 0.6 mg via ORAL
  Filled 2019-08-15 (×5): qty 1

## 2019-08-15 NOTE — Plan of Care (Signed)
Problem: Education: Goal: Knowledge of General Education information will improve Description: Including pain rating scale, medication(s)/side effects and non-pharmacologic comfort measures 08/15/2019 0253 by Darlys Gales, RN Outcome: Progressing 08/14/2019 2355 by Darlys Gales, RN Outcome: Progressing   Problem: Health Behavior/Discharge Planning: Goal: Ability to manage health-related needs will improve 08/15/2019 0253 by Darlys Gales, RN Outcome: Progressing 08/14/2019 2355 by Darlys Gales, RN Outcome: Progressing   Problem: Clinical Measurements: Goal: Ability to maintain clinical measurements within normal limits will improve 08/15/2019 0253 by Darlys Gales, RN Outcome: Progressing 08/14/2019 2355 by Darlys Gales, RN Outcome: Progressing Goal: Will remain free from infection 08/15/2019 0253 by Darlys Gales, RN Outcome: Progressing 08/14/2019 2355 by Darlys Gales, RN Outcome: Progressing Goal: Diagnostic test results will improve 08/15/2019 0253 by Darlys Gales, RN Outcome: Progressing 08/14/2019 2355 by Darlys Gales, RN Outcome: Progressing Goal: Respiratory complications will improve 08/15/2019 0253 by Darlys Gales, RN Outcome: Progressing 08/14/2019 2355 by Darlys Gales, RN Outcome: Progressing Goal: Cardiovascular complication will be avoided 08/15/2019 0253 by Darlys Gales, RN Outcome: Progressing 08/14/2019 2355 by Darlys Gales, RN Outcome: Progressing   Problem: Activity: Goal: Risk for activity intolerance will decrease 08/15/2019 0253 by Darlys Gales, RN Outcome: Progressing 08/14/2019 2355 by Darlys Gales, RN Outcome: Progressing   Problem: Nutrition: Goal: Adequate nutrition will be maintained 08/15/2019 0253 by Darlys Gales, RN Outcome: Progressing 08/14/2019 2355 by Darlys Gales, RN Outcome: Progressing   Problem: Coping: Goal: Level of anxiety will decrease 08/15/2019 0253 by Darlys Gales, RN Outcome: Progressing 08/14/2019 2355 by Darlys Gales, RN Outcome: Progressing   Problem: Elimination: Goal: Will not experience complications related to bowel motility 08/15/2019 0253 by Darlys Gales, RN Outcome: Progressing 08/14/2019 2355 by Darlys Gales, RN Outcome: Progressing Goal: Will not experience complications related to urinary retention 08/15/2019 0253 by Darlys Gales, RN Outcome: Progressing 08/14/2019 2355 by Darlys Gales, RN Outcome: Progressing   Problem: Pain Managment: Goal: General experience of comfort will improve 08/15/2019 0253 by Darlys Gales, RN Outcome: Progressing 08/14/2019 2355 by Darlys Gales, RN Outcome: Progressing   Problem: Safety: Goal: Ability to remain free from injury will improve 08/15/2019 0253 by Darlys Gales, RN Outcome: Progressing 08/14/2019 2355 by Darlys Gales, RN Outcome: Progressing   Problem: Skin Integrity: Goal: Risk for impaired skin integrity will decrease 08/15/2019 0253 by Darlys Gales, RN Outcome: Progressing 08/14/2019 2355 by Darlys Gales, RN Outcome: Progressing   Problem: Education: Goal: Will demonstrate proper wound care and an understanding of methods to prevent future damage 08/15/2019 0253 by Darlys Gales, RN Outcome: Progressing 08/14/2019 2355 by Darlys Gales, RN Outcome: Progressing Goal: Knowledge of disease or condition will improve 08/15/2019 0253 by Darlys Gales, RN Outcome: Progressing 08/14/2019 2355 by Darlys Gales, RN Outcome: Progressing Goal: Knowledge of the prescribed therapeutic regimen will improve 08/15/2019 0253 by Darlys Gales, RN Outcome: Progressing 08/14/2019 2355 by Darlys Gales, RN Outcome: Progressing Goal: Individualized Educational Video(s) 08/15/2019 0253 by Darlys Gales, RN Outcome: Progressing 08/14/2019 2355 by Darlys Gales, RN Outcome: Progressing   Problem: Activity: Goal: Risk for activity intolerance will decrease 08/15/2019 0253 by Darlys Gales, RN Outcome: Progressing 08/14/2019 2355 by Darlys Gales, RN Outcome: Progressing   Problem: Cardiac: Goal: Will achieve and/or maintain hemodynamic stability 08/15/2019 0253 by Darlys Gales, RN Outcome: Progressing 08/14/2019 2355  by Darlys Gales, RN Outcome: Progressing   Problem: Clinical Measurements: Goal: Postoperative complications will be avoided or minimized 08/15/2019 0253 by Darlys Gales, RN Outcome: Progressing 08/14/2019 2355 by Darlys Gales, RN Outcome: Progressing   Problem: Respiratory: Goal: Respiratory status will improve 08/15/2019 0253 by Darlys Gales, RN Outcome: Progressing 08/14/2019 2355 by Darlys Gales, RN Outcome: Progressing   Problem: Skin Integrity: Goal: Wound healing without signs and symptoms of infection 08/15/2019 0253 by Darlys Gales, RN Outcome: Progressing 08/14/2019 2355 by Darlys Gales, RN Outcome: Progressing Goal: Risk for impaired skin integrity will decrease 08/15/2019 0253 by Darlys Gales, RN Outcome: Progressing 08/14/2019 2355 by Darlys Gales, RN Outcome: Progressing   Problem: Urinary Elimination: Goal: Ability to achieve and maintain adequate renal perfusion and functioning will improve 08/15/2019 0253 by Darlys Gales, RN Outcome: Progressing 08/14/2019 2355 by Darlys Gales, RN Outcome: Progressing

## 2019-08-15 NOTE — Progress Notes (Signed)
1 Day Post-Op Procedure(s) (LRB): CORONARY ARTERY BYPASS GRAFTING (CABG) endoscopic right saphenous vein harvesting to Diag1 and Om1; LIMA to (m)LAD; RIMA to PDA. (N/A) TRANSESOPHAGEAL ECHOCARDIOGRAM (TEE) (N/A) Subjective: Incisional pain, now controlled  Objective: Vital signs in last 24 hours: Temp:  [95 F (35 C)-98.4 F (36.9 C)] 98.2 F (36.8 C) (09/25 0800) Pulse Rate:  [70-101] 90 (09/25 0800) Cardiac Rhythm: Normal sinus rhythm;Atrial paced (09/24 2000) Resp:  [12-25] 17 (09/25 0800) BP: (90-150)/(41-86) 103/50 (09/25 0800) SpO2:  [94 %-100 %] 97 % (09/25 0800) Arterial Line BP: (99-167)/(41-69) 123/42 (09/25 0800) FiO2 (%):  [40 %-50 %] 40 % (09/24 1759) Weight:  [95.1 kg] 95.1 kg (09/25 0500)  Hemodynamic parameters for last 24 hours: PAP: (17-55)/(8-24) 26/12 CO:  [3 L/min-5.8 L/min] 5.8 L/min CI:  [1.5 L/min/m2-2.9 L/min/m2] 2.9 L/min/m2  Intake/Output from previous day: 09/24 0701 - 09/25 0700 In: 3340.5 [I.V.:1797.4; Blood:400; IV Piggyback:1143.1] Out: [id number] [Urine:2295; Blood:700; Chest Tube:690] Intake/Output this shift: Total I/O In: 304.1 [P.O.:240; I.V.:64.1] Out: 125 [Urine:95; Chest Tube:30]  General appearance: alert, cooperative and no distress Neurologic: intact Heart: regular rate and rhythm, S1, S2 normal, no murmur, click, rub or gallop Lungs: clear to auscultation bilaterally Extremities: extremities normal, atraumatic, no cyanosis or edema Wound: dressed, dry  Lab Results: Recent Labs    08/14/19 1955 08/15/19 0308  WBC 13.6* 15.5*  HGB 9.2* 9.0*  HCT 27.6* 27.7*  PLT 138* 122*   BMET:  Recent Labs    08/14/19 1955 08/15/19 0308  NA 138 140  K 3.5 4.6  CL 108 110  CO2 23 24  GLUCOSE 134* 107*  BUN 15 16  CREATININE 0.96 1.11  CALCIUM 7.7* 7.8*    PT/INR:  Recent Labs    08/14/19 1326  LABPROT 16.3*  INR 1.3*   ABG    Component Value Date/Time   PHART 7.383 08/14/2019 1837   HCO3 22.1 08/14/2019 1837   TCO2 23  08/14/2019 1837   ACIDBASEDEF 3.0 (H) 08/14/2019 1837   O2SAT 99.0 08/14/2019 1837   CBG (last 3)  Recent Labs    08/15/19 0234 08/15/19 0348 08/15/19 0459  GLUCAP 109* 101* 132*    Assessment/Plan: S/P Procedure(s) (LRB): CORONARY ARTERY BYPASS GRAFTING (CABG) endoscopic right saphenous vein harvesting to Diag1 and Om1; LIMA to (m)LAD; RIMA to PDA. (N/A) TRANSESOPHAGEAL ECHOCARDIOGRAM (TEE) (N/A) Diabetes control leave tubes, pacing wires, oob to chair; pulm toilet; pain control   LOS: 2 days    Wonda Olds 08/15/2019

## 2019-08-15 NOTE — Discharge Summary (Signed)
Physician Discharge Summary  Patient ID: Timothy [name] MRN: [id number] DOB/AGE: 11-26-1940 78 y.o.  Admit date: 08/12/2019 Discharge date: 08/19/2019  Admission Diagnoses:  Discharge Diagnoses:  Active Problems:   Exertional chest pain   Exertional dyspnea   CAD, multiple vessel   S/P CABG x 4  Patient Active Problem List   Diagnosis Date Noted  . S/P CABG x 4 08/14/2019  . CAD, multiple vessel 08/12/2019  . Exertional chest pain 01/29/2019  . Exertional dyspnea 01/29/2019  . Exertional angina ([place]) 12/23/2018  . Neck pain 08/29/2017  . Numbness 08/29/2017  . Pain 08/29/2017  . Primary osteoarthritis of first carpometacarpal joint of left hand 08/29/2017  . Anxiety 07/30/2017  . Diabetes mellitus ([place]) 07/30/2017  . Hypercholesterolemia 07/30/2017  . Essential hypertension 07/30/2017   HPI: at time of consult:  The patient is a 78 year old man who is been followed by Dr. Virgina Jock who has had symptoms of chest pain for approximately 1 year.  The symptoms occur primarily with exertion.  He reports that he has been doing well until recently on medical therapy.  Symptoms however have increased and he was having pain with minimal activity and the duration of the symptoms were increasing.  He did not have rest pain.  He did have some shortness of breath but denied orthopnea.  He has no previous history of myocardial infarction.  He has no history of stroke.  He underwent a stress test which was submaximal and subsequent to that underwent left heart catheterization which demonstrated severe left main disease.  Cardiothoracic surgical consultation was obtained with Dr. [name] who recommended CABG.  Discharged Condition: good  Hospital Course: The patient was admitted due to his abnormal stress test for left heart catheterization and coronary angiography which was done and revealed severe left main coronary artery disease.  Due to these findings cardiothoracic surgical consultation  was obtained with Dr. [name] who recommended CABG.  He was felt to be medically stable to proceed and on 08/14/2019 he was taken the operating room where he underwent the below described procedure.  He tolerated it well was taken to the surgical intensive care unit in stable condition.  Postoperative hospital course:  The patient has done well.  He was weaned from the ventilator using standard protocols without difficulty.  All routine lines, monitors and drainage devices have been discontinued in standard fashion.  He has maintained hemodynamics and normal sinus rhythm.  He did have short episodes of SVT and metoprolol was adjusted.  His blood sugars have been under adequate control and he has been resumed on his metformin after usual insulin drip protocols were discontinued.  He did have some postoperative hypertension but was transitioned to his previous home medications with improvement.  He did have an expected acute blood loss anemia and values stabilized.  Most recent hemoglobin hematocrit dated 08/18/2019 were 8.5 and 26 respectively.  Renal function did show a slight increase in creatinine during the postoperative period but he did return to normal function with most recent BUN and creatinine on 08/18/2019 15 and 0.99.  Oxygen was weaned and he maintained good saturations on room air.  Incisions were noted to be healing well without evidence of infection.  He was tolerating diet.  At the time of discharge the patient was felt to be quite stable  Consults: cardiology  Significant Diagnostic Studies: angiography: cardiac cath  Treatments: surgery:   CARDIOTHORACIC SURGERY OPERATIVE NOTE  Date of Procedure:    08/14/2019  Preoperative Diagnosis:  Severe 3-vessel Coronary Artery Disease including LM CAD; progressive angina  Postoperative Diagnosis:    Same  Procedure:        Coronary Artery Bypass Grafting x 4             Left Internal Mammary Artery to Distal Left Anterior  Descending Coronary Artery; pedicled RIMA to  Posterior Descending Coronary Artery; Saphenous Vein Graft to  Obtuse Marginal Branch of Left Circumflex Coronary Artery, Sapheonous Vein Graft to  Diagonal Branch Coronary Artery, Endoscopic Vein Harvest from right Thigh; bilateral IMA harvesting; completion angiography with indocyanine green fluorescence imaging (SPY)  Surgeon:        B. Murvin Natal, MD  Assistant:       Jadene Pierini PA-C  Anesthesia:    GET  Operative Findings: ? Preserved left ventricular systolic function ? good quality  internal mammary artery conduits ? fair quality saphenous vein conduit ? goodquality target vessels for grafting  CARDIOTHORACIC SURGERY OPERATIVE NOTE  Date of Procedure:    08/14/2019  Preoperative Diagnosis:      Severe 3-vessel Coronary Artery Disease including LM CAD; progressive angina  Postoperative Diagnosis:    Same  Procedure:        Coronary Artery Bypass Grafting x 4             Left Internal Mammary Artery to Distal Left Anterior Descending Coronary Artery; pedicled RIMA to  Posterior Descending Coronary Artery; Saphenous Vein Graft to  Obtuse Marginal Branch of Left Circumflex Coronary Artery, Sapheonous Vein Graft to  Diagonal Branch Coronary Artery, Endoscopic Vein Harvest from right Thigh; bilateral IMA harvesting; completion angiography with indocyanine green fluorescence imaging (SPY)  Surgeon:        B. Murvin Natal, MD  Assistant:       Jadene Pierini PA-C  Anesthesia:    GET  Operative Findings: ? Preserved left ventricular systolic function ? good quality  internal mammary artery conduits ? fair quality saphenous vein conduit ? goodquality target vessels for grafting Discharge Exam: Blood pressure (!) 173/81, pulse 94, temperature (!) 97.4 F (36.3 C), temperature source Oral, resp. rate 19, height 5\' 6"  (1.676 m), weight 89.6 kg, SpO2 95 %.  General appearance: alert, cooperative and no distress Heart:  regular rate and rhythm Lungs: min dim in left base Abdomen: benign Extremities: no edema Wound: incis healing well Disposition: Discharge disposition: 01-Home or Self Care       Discharge Instructions    Discharge patient   Complete by: As directed    Discharge disposition: 01-Home or Self Care   Discharge patient date: 08/19/2019     Allergies as of 08/19/2019   No Known Allergies     Medication List    STOP taking these medications   bismuth subsalicylate [id number] MG chewable tablet Commonly known as: Pepto-Bismol   diltiazem 360 MG 24 hr capsule Commonly known as: TIAZAC   HYDROcodone-acetaminophen 5-325 MG tablet Commonly known as: Norco   isosorbide mononitrate 30 MG 24 hr tablet Commonly known as: IMDUR   nitroGLYCERIN 0.4 MG SL tablet Commonly known as: NITROSTAT   pravastatin 40 MG tablet Commonly known as: PRAVACHOL   sucralfate 1 GM/10ML suspension Commonly known as: Carafate     TAKE these medications   amLODipine 10 MG tablet Commonly known as: NORVASC Take 10 mg by mouth at bedtime.   aspirin 325 MG EC tablet Take 1 tablet (325 mg total) by mouth daily. What changed:   medication strength  how much to take  ferrous [id number] C-folic acid capsule Commonly known as: TRINSICON / FOLTRIN Take 1 capsule by mouth 2 (two) times daily after a meal.   hydrochlorothiazide 25 MG tablet Commonly known as: HYDRODIURIL Take 1 tablet (25 mg total) by mouth daily. What changed: when to take this   metFORMIN 850 MG tablet Commonly known as: GLUCOPHAGE Take 850 mg by mouth 2 (two) times daily with a meal.   metoprolol tartrate 25 MG tablet Commonly known as: LOPRESSOR Take 1 tablet (25 mg total) by mouth 2 (two) times daily.   olmesartan 40 MG tablet Commonly known as: BENICAR Take 40 mg by mouth daily.   omeprazole 20 MG capsule Commonly known as: PRILOSEC Take 1 capsule (20 mg total) by mouth 2 (two) times daily. Take on an empty  stomach 20 - 30 minutes prior to food. What changed: when to take this   rosuvastatin 20 MG tablet Commonly known as: CRESTOR Take 0.5 tablets (10 mg total) by mouth daily at 6 PM.   sertraline 100 MG tablet Commonly known as: ZOLOFT Take 100 mg by mouth daily.   traMADol 50 MG tablet Commonly known as: ULTRAM Take 1-2 tablets (50-100 mg total) by mouth every 6 (six) hours as needed for moderate pain.      Follow-up Information    Patwardhan, Reynold Bowen, MD Follow up.   Specialties: Cardiology, Radiology Why: You have an appointment to see your cardiologist on 09/11/2019 at 3:45 PM. Contact information: [place] [address] [phone]          The patient has been discharged on:   1.Beta Blocker:  Yes [  y ]                              No   [   ]                              If No, reason:  2.Ace Inhibitor/ARB: Yes [ y  ]                                     No  [    ]                                     If No, reason:  3.Statin:   Yes [ y  ]                  No  [   ]                  If No, reason:  4.Ecasa:  Yes  Blue.Reese   ]                  No   [   ]                  If No, reason:  Signed: Wilder Glade Rechy Bost PA-C 08/19/2019, 7:43 AM

## 2019-08-15 NOTE — Discharge Instructions (Signed)
Coronary Artery Bypass Grafting, Care After This sheet gives you information about how to care for yourself after your procedure. Your doctor may also give you more specific instructions. If you have problems or questions, call your doctor. What can I expect after the procedure? After the procedure, it is common to:  Feel sick to your stomach (nauseous).  Not want to eat as much as normal (lack of appetite).  Have trouble pooping (constipation).  Have weakness and tiredness (fatigue).  Feel sad (depressed) or grouchy (irritable).  Have pain or discomfort around the cuts from surgery (incisions). Follow these instructions at home: Medicines  Take over-the-counter and prescription medicines only as told by your doctor. Do not stop taking medicines or start any new medicines unless your doctor says it is okay.  If you were prescribed an antibiotic medicine, take it as told by your doctor. Do not stop taking the antibiotic even if you start to feel better. Incision care   Follow instructions from your doctor about how to take care of your cuts from surgery. Make sure you: ? Wash your hands with soap and water before and after you change your bandage (dressing). If you cannot use soap and water, use hand sanitizer. ? Change your bandage as told by your doctor. ? Leave stitches (sutures), skin glue, or skin tape (adhesive) strips in place. They may need to stay in place for 2 weeks or longer. If tape strips get loose and curl up, you may trim the loose edges. Do not remove tape strips completely unless your doctor says it is okay.  Make sure the surgery cuts are clean, dry, and protected.  Check your cut areas every day for signs of infection. Check for: ? More redness, swelling, or pain. ? More fluid or blood. ? Warmth. ? Pus or a bad smell.  If cuts were made in your legs: ? Avoid crossing your legs. Avoid sitting for long periods of time Medicines  Take over-the-counter and  prescription medicines only as told by your health care provider.  Ask your health care provider if the medicine prescribed to you requires you to avoid driving or using heavy machinery. General instructions  Raise (elevate) your legs above the level of your heart while you are sitting or lying down.  Avoid crossing your legs.  Avoid sitting for long periods of time. Change positions every 30 minutes.  Do any exercises your health care providers have given you. These may include deep breathing, coughing, and walking exercises.  Do not take baths, swim, or use a hot tub until your health care provider approves. Ask your health care provider if you may take showers. You may only be allowed to take sponge baths.  Wear compression stockings as told by your health care provider. These stockings help to prevent blood clots and reduce swelling in your legs.  Keep all follow-up visits as told by your health care provider. This is important. Contact a health care provider if:  Medicine does not help your pain.  Your pain gets worse.  You have new leg bruises or your leg bruises get bigger.  Your leg feels numb.  You have more redness, swelling, or pain around your incision.  You have fluid or blood coming from your incision.  Your incision feels warm to the touch.  You have pus or a bad smell coming from your incision.  You have a fever. Get help right away if:  Your pain is severe.  You develop pain,  tenderness, warmth, redness, or swelling in any part of your leg.  You have chest pain.  You have trouble breathing. Summary  Raise (elevate) your legs above the level of your heart while you are sitting or lying down.  Wear compression stockings as told by your health care provider.  Make sure you know which symptoms should prompt you to contact your health care provider.  Keep all follow-up visits as told by your health care provider. This information is not intended to  replace advice given to you by your health care provider. Make sure you discuss any questions you have with your health care provider. Document Released: 07/19/2011 Document Revised: 10/14/2018 Document Reviewed: 10/14/2018 Elsevier Patient Education  2020 [place] periods of time. Change positions every 30 minutes. ? Raise (elevate) your legs when you are sitting. Bathing  Do not take baths, swim, or use a hot tub until your doctor says it is okay.  You may shower. Pat the surgery cuts dry. Do not rub the cuts to dry.   Eating and drinking   Eat foods that are high in fiber, such as beans, nuts, whole grains, and raw fruits and vegetables. Any meats you eat should be lean cut. Avoid canned, processed, and fried foods. This can help prevent trouble pooping. This is also a part of a heart-healthy diet.  Drink enough fluid to keep your pee (urine) pale yellow.  Do not drink alcohol until you are fully recovered. Ask your doctor when it is safe to drink alcohol. Activity  Rest and limit your activity as told by your doctor. You may be told to: ? Stop any activity right away if you have chest pain, shortness of breath, irregular heartbeats, or dizziness. Get help right away if you have any of these symptoms. ? Move around often for short periods or take short walks as told by your doctor. Slowly increase your activities. ? Avoid lifting, pushing, or pulling anything that is heavier than 10 lb (4.5 kg) for at least 6 weeks or as told by your doctor.  Do physical therapy or a cardiac rehab (cardiac rehabilitation) program as told by your doctor. ? Physical therapy involves doing exercises to maintain movement and build strength and endurance. ? A cardiac rehab program includes:  Exercise training.  Education.  Counseling.  Do not drive until your doctor says it is okay.  Ask your doctor when you can go back to work.  Ask your doctor when you can be sexually  active. General instructions  Do not drive or use heavy machinery while taking prescription pain medicine.  Do not use any products that contain nicotine or tobacco. These include cigarettes, e-cigarettes, and chewing tobacco. If you need help quitting, ask your doctor.  Take 2-3 deep breaths every few hours during the day while you get better. This helps expand your lungs and prevent problems.  If you were given a device called an incentive spirometer, use it several times a day to practice deep breathing. Support your chest with a pillow or your arms when you take deep breaths or cough.  Wear compression stockings as told by your doctor.  Weigh yourself every day. This helps to see if your body is holding (retaining) fluid that may make your heart and lungs work harder.  Keep all follow-up visits as told by your doctor. This is important. Contact a doctor if:  You have more redness, swelling, or pain around any cut.  You have more fluid  or blood coming from any cut.  Any cut feels warm to the touch.  You have pus or a bad smell coming from any cut.  You have a fever.  You have swelling in your ankles or legs.  You have pain in your legs.  You gain 2 lb (0.9 kg) or more a day.  You feel sick to your stomach or you throw up (vomit).  You have watery poop (diarrhea). Get help right away if:  You have chest pain that goes to your jaw or arms.  You are short of breath.  You have a fast or irregular heartbeat.  You notice a "clicking" in your breastbone (sternum) when you move.  You have any signs of a stroke. "BE FAST" is an easy way to remember the main warning signs: ? B - Balance. Signs are dizziness, sudden trouble walking, or loss of balance. ? E - Eyes. Signs are trouble seeing or a change in how you see. ? F - Face. Signs are sudden weakness or loss of feeling of the face, or the face or eyelid drooping on one side. ? A - Arms. Signs are weakness or loss of  feeling in an arm. This happens suddenly and usually on one side of the body. ? S - Speech. Signs are sudden trouble speaking, slurred speech, or trouble understanding what people say. ? T - Time. Time to call emergency services. Write down what time symptoms started.  You have other signs of a stroke, such as: ? A sudden, very bad headache with no known cause. ? Feeling sick to your stomach. ? Throwing up. ? Jerky movements you cannot control (seizure). These symptoms may be an emergency. Do not wait to see if the symptoms will go away. Get medical help right away. Call your local emergency services (911 in the [place] Do not drive yourself to the hospital. Summary  After the procedure, it is common to have pain or discomfort in the cuts from surgery (incisions).  Do not take baths, swim, or use a hot tub until your doctor says it is okay.  Slowly increase your activities. You may need physical therapy or cardiac rehab.  Weigh yourself every day. This helps to see if your body is holding fluid. This information is not intended to replace advice given to you by your health care provider. Make sure you discuss any questions you have with your health care provider. Document Released: 11/11/2013 Document Revised: 07/16/2018 Document Reviewed: 07/16/2018 Elsevier Patient Education  2020 [place].  Endoscopic Saphenous Vein Harvesting, Care After This sheet gives you information about how to care for yourself after your procedure. Your health care provider may also give you more specific instructions. If you have problems or questions, contact your health care provider. What can I expect after the procedure? After the procedure, it is common to have:  Pain.  Bruising.  Swelling.  Numbness. Follow these instructions at home: Incision care   Follow instructions from your health care provider about how to take care of your incisions. Make sure you: ? Wash your hands with soap and  water before and after you change your bandages (dressings). If soap and water are not available, use hand sanitizer. ? Change your dressings as told by your health care provider. ? Leave stitches (sutures), skin glue, or adhesive strips in place. These skin closures may need to stay in place for 2 weeks or longer. If adhesive strip edges start to loosen and curl up,  you may trim the loose edges. Do not remove adhesive strips completely unless your health care provider tells you to do that.  Check your incision areas every day for signs of infection. Check for: ? More redness, swelling, or pain. ? Fluid or blood. ? Warmth. ? Pus or a bad smell. Medicines  Take over-the-counter and prescription medicines only as told by your health care provider.  Ask your health care provider if the medicine prescribed to you requires you to avoid driving or using heavy machinery. General instructions  Raise (elevate) your legs above the level of your heart while you are sitting or lying down.  Avoid crossing your legs.  Avoid sitting for long periods of time. Change positions every 30 minutes.  Do any exercises your health care providers have given you. These may include deep breathing, coughing, and walking exercises.  Do not take baths, swim, or use a hot tub until your health care provider approves. Ask your health care provider if you may take showers. You may only be allowed to take sponge baths.  Wear compression stockings as told by your health care provider. These stockings help to prevent blood clots and reduce swelling in your legs.  Keep all follow-up visits as told by your health care provider. This is important. Contact a health care provider if:  Medicine does not help your pain.  Your pain gets worse.  You have new leg bruises or your leg bruises get bigger.  Your leg feels numb.  You have more redness, swelling, or pain around your incision.  You have fluid or blood coming  from your incision.  Your incision feels warm to the touch.  You have pus or a bad smell coming from your incision.  You have a fever. Get help right away if:  Your pain is severe.  You develop pain, tenderness, warmth, redness, or swelling in any part of your leg.  You have chest pain.  You have trouble breathing. Summary  Raise (elevate) your legs above the level of your heart while you are sitting or lying down.  Wear compression stockings as told by your health care provider.  Make sure you know which symptoms should prompt you to contact your health care provider.  Keep all follow-up visits as told by your health care provider. This information is not intended to replace advice given to you by your health care provider. Make sure you discuss any questions you have with your health care provider. Document Released: 07/19/2011 Document Revised: 10/14/2018 Document Reviewed: 10/14/2018 Elsevier Patient Education  2020 [employer].

## 2019-08-15 NOTE — Progress Notes (Signed)
EVENING ROUNDS NOTE :     [address]       [place] [address]             [phone]                 1 Day Post-Op Procedure(s) (LRB): CORONARY ARTERY BYPASS GRAFTING (CABG) endoscopic right saphenous vein harvesting to Diag1 and Om1; LIMA to (m)LAD; RIMA to PDA. (N/A) TRANSESOPHAGEAL ECHOCARDIOGRAM (TEE) (N/A)   Total Length of Stay:  LOS: 2 days  Events:  No events Resting in bed Off drips    BP 121/62   Pulse 90   Temp 98.6 F (37 C) (Oral)   Resp 13   Ht 5\' 6"  (1.676 m)   Wt 95.1 kg   SpO2 99%   BMI 33.84 kg/m   PAP: (17-55)/(8-24) 28/12 CO:  [5.8 L/min] 5.8 L/min CI:  [2.9 L/min/m2] 2.9 L/min/m2  Vent Mode: PSV;CPAP FiO2 (%):  [40 %] 40 % Set Rate:  [4 bmp] 4 bmp Vt Set:  [510 mL] 510 mL PEEP:  [5 cmH20] 5 cmH20 Pressure Support:  [10 cmH20] 10 cmH20 Plateau Pressure:  [26 cmH20] 26 cmH20  . sodium chloride Stopped (08/14/19 1710)  . sodium chloride    . sodium chloride 10 mL/hr at 08/14/19 1402  . cefUROXime (ZINACEF)  IV Stopped (08/15/19 0346)  . lactated ringers    . lactated ringers 20 mL/hr at 08/15/19 1100  . phenylephrine (NEO-SYNEPHRINE) Adult infusion Stopped (08/14/19 1801)    I/O last 3 completed shifts: In: 3340.5 [I.V.:1797.4; Blood:400; IV Piggyback:1143.1] Out: 3685 [Urine:2295; Blood:700; Chest Tube:690]   CBC Latest Ref Rng & Units 08/15/2019 08/14/2019 08/14/2019  WBC 4.0 - 10.5 K/uL 15.5(H) 13.6(H) -  Hemoglobin 13.0 - 17.0 g/dL 9.0(L) 9.2(L) 8.8(L)  Hematocrit 39.0 - 52.0 % 27.7(L) 27.6(L) 26.0(L)  Platelets 150 - 400 K/uL 122(L) 138(L) -    BMP Latest Ref Rng & Units 08/15/2019 08/14/2019 08/14/2019  Glucose 70 - 99 mg/dL 107(H) 134(H) -  BUN 8 - 23 mg/dL 16 15 -  Creatinine 0.61 - 1.24 mg/dL 1.11 0.96 -  BUN/Creat Ratio 10 - 24 - - -  Sodium 135 - 145 mmol/L 140 138 141  Potassium 3.5 - 5.1 mmol/L 4.6 3.5 3.8  Chloride 98 - 111 mmol/L 110 108 -  CO2 22 - 32 mmol/L 24 23 -  Calcium 8.9 - 10.3 mg/dL  7.8(L) 7.7(L) -    ABG    Component Value Date/Time   PHART 7.383 08/14/2019 1837   PCO2ART 36.8 08/14/2019 1837   PO2ART 116.0 (H) 08/14/2019 1837   HCO3 22.1 08/14/2019 1837   TCO2 23 08/14/2019 1837   ACIDBASEDEF 3.0 (H) 08/14/2019 1837   O2SAT 99.0 08/14/2019 1837       Melodie Bouillon, MD 08/15/2019 4:08 PM

## 2019-08-15 NOTE — Consult Note (Signed)
   [facility] Inpatient Consult   08/15/2019  Timothy [name] May 07, 1941 [id number]    HTA CSNP  Patient is currently active with [facility] for chronic disease management services.Patient has been engaged by a [facility] C-SNP(chronic special needs plan) coordinator for DM, who is aware of this hospitalization (M. Sandlin). Our community based plan of care has focused on disease management and community resource support. Patient will receive a post hospital call and will be evaluated for assessments and followed-up for disease process education.  Inpatient [facility] team member will make aware of [facility] following.   Of note, [facility] Care Management services does not replace or interfere with any services that are needed or arranged by inpatient case management or social work.   For additional questions or referrals please contact:  Edwena Felty A. Storm Dulski, BSN, RN-BC [facility] Liaison  Cell: [phone]

## 2019-08-16 ENCOUNTER — Inpatient Hospital Stay (HOSPITAL_COMMUNITY): Payer: HMO

## 2019-08-16 LAB — CBC
HCT: 26 % — ABNORMAL LOW (ref 39.0–52.0)
Hemoglobin: 8.5 g/dL — ABNORMAL LOW (ref 13.0–17.0)
MCH: 29 pg (ref 26.0–34.0)
MCHC: 32.7 g/dL (ref 30.0–36.0)
MCV: 88.7 fL (ref 80.0–100.0)
Platelets: 121 10*3/uL — ABNORMAL LOW (ref 150–400)
RBC: 2.93 MIL/uL — ABNORMAL LOW (ref 4.22–5.81)
RDW: 14.4 % (ref 11.5–15.5)
WBC: 10 10*3/uL (ref 4.0–10.5)
nRBC: 0 % (ref 0.0–0.2)

## 2019-08-16 LAB — BASIC METABOLIC PANEL
Anion gap: 8 (ref 5–15)
BUN: 19 mg/dL (ref 8–23)
CO2: 24 mmol/L (ref 22–32)
Calcium: 8 mg/dL — ABNORMAL LOW (ref 8.9–10.3)
Chloride: 104 mmol/L (ref 98–111)
Creatinine, Ser: 1.18 mg/dL (ref 0.61–1.24)
GFR calc Af Amer: >60 mL/min (ref >60)
GFR calc non Af Amer: 58 mL/min — ABNORMAL LOW (ref >60)
Glucose, Bld: 118 mg/dL — ABNORMAL HIGH (ref 70–99)
Potassium: 3.9 mmol/L (ref 3.5–5.1)
Sodium: 136 mmol/L (ref 135–145)

## 2019-08-16 LAB — GLUCOSE, CAPILLARY
Glucose-Capillary: 108 mg/dL — ABNORMAL HIGH (ref 70–99)
Glucose-Capillary: 123 mg/dL — ABNORMAL HIGH (ref 70–99)
Glucose-Capillary: 128 mg/dL — ABNORMAL HIGH (ref 70–99)
Glucose-Capillary: 129 mg/dL — ABNORMAL HIGH (ref 70–99)

## 2019-08-16 MED ORDER — SODIUM CHLORIDE 0.9% FLUSH: 10.0000 mL | INTRAVENOUS | Status: DC | PRN

## 2019-08-16 MED ORDER — SODIUM CHLORIDE 0.9% FLUSH
10.0000 mL | Freq: Two times a day (BID) | INTRAVENOUS | Status: DC
  Administered 2019-08-16: 10 mL

## 2019-08-16 MED ORDER — INSULIN ASPART 100 UNIT/ML ~~LOC~~ SOLN
0.0000 [IU] | Freq: Three times a day (TID) | SUBCUTANEOUS | Status: DC
  Administered 2019-08-17 – 2019-08-18 (×4): 2 [IU] via SUBCUTANEOUS

## 2019-08-16 MED ORDER — FUROSEMIDE 40 MG PO TABS
40.0000 mg | ORAL_TABLET | Freq: Every day | ORAL | Status: DC
  Administered 2019-08-16 – 2019-08-19 (×4): 40 mg via ORAL
  Filled 2019-08-16 (×4): qty 1

## 2019-08-16 MED ORDER — POTASSIUM CHLORIDE ER 10 MEQ PO TBCR
40.0000 meq | EXTENDED_RELEASE_TABLET | Freq: Every day | ORAL | Status: DC
  Administered 2019-08-16 – 2019-08-19 (×4): 40 meq via ORAL
  Filled 2019-08-16 (×7): qty 4

## 2019-08-16 NOTE — Progress Notes (Signed)
Came by to walk with patient. Eating lunch. Will follow up on Monday.Barnet Pall, RN,BSN 08/16/2019 1:52 PM

## 2019-08-16 NOTE — Progress Notes (Signed)
      [address]       [place] [address]             [phone]                 2 Days Post-Op Procedure(s) (LRB): CORONARY ARTERY BYPASS GRAFTING (CABG) endoscopic right saphenous vein harvesting to Diag1 and Om1; LIMA to (m)LAD; RIMA to PDA. (N/A) TRANSESOPHAGEAL ECHOCARDIOGRAM (TEE) (N/A)   Events: No events.  ambulated _______________________________________________________________ Vitals: BP 117/61   Pulse 77   Temp 97.6 F (36.4 C) (Oral)   Resp (!) 23   Ht 5\' 6"  (1.676 m)   Wt 95.4 kg   SpO2 98%   BMI 33.95 kg/m   - Neuro: awake  - Cardiovascular: sinus  Drips: no.   - Pulm: EWOB    ABG    Component Value Date/Time   PHART 7.383 08/14/2019 1837   PCO2ART 36.8 08/14/2019 1837   PO2ART 116.0 (H) 08/14/2019 1837   HCO3 22.1 08/14/2019 1837   TCO2 23 08/14/2019 1837   ACIDBASEDEF 3.0 (H) 08/14/2019 1837   O2SAT 99.0 08/14/2019 1837    - Abd: soft NT - Extremity: no edema  .Intake/Output      09/25 0701 - 09/26 0700 09/26 0701 - 09/27 0700   P.O. 360    I.V. (mL/kg) 171.8 (1.8) 0 (0)   Blood     IV Piggyback 200    Total Intake(mL/kg) 731.8 (7.7) 0 (0)   Urine (mL/kg/hr) 955 (0.4) 80 (0.3)   Blood     Chest Tube 440 0   Total Output 1395 80   Net -663.2 -80           _______________________________________________________________ Labs: CBC Latest Ref Rng & Units 08/16/2019 08/15/2019 08/15/2019  WBC 4.0 - 10.5 K/uL 10.0 10.4 15.5(H)  Hemoglobin 13.0 - 17.0 g/dL 8.5(L) 8.0(L) 9.0(L)  Hematocrit 39.0 - 52.0 % 26.0(L) 24.6(L) 27.7(L)  Platelets 150 - 400 K/uL 121(L) 108(L) 122(L)   CMP Latest Ref Rng & Units 08/16/2019 08/15/2019 08/15/2019  Glucose 70 - 99 mg/dL 118(H) 128(H) 107(H)  BUN 8 - 23 mg/dL 19 21 16   Creatinine 0.61 - 1.24 mg/dL 1.18 1.35(H) 1.11  Sodium 135 - 145 mmol/L 136 137 140  Potassium 3.5 - 5.1 mmol/L 3.9 4.0 4.6  Chloride 98 - 111 mmol/L 104 106 110  CO2 22 - 32 mmol/L 24 21(L) 24  Calcium 8.9 - 10.3  mg/dL 8.0(L) 7.7(L) 7.8(L)  Total Protein 6.5 - 8.1 g/dL - - -  Total Bilirubin 0.3 - 1.2 mg/dL - - -  Alkaline Phos 38 - 126 U/L - - -  AST 15 - 41 U/L - - -  ALT 0 - 44 U/L - - -    CXR: stable  _______________________________________________________________  Assessment and Plan: POD 2  s/p CABG  Neuro: pain controlled CV: on asp statin, BB.  Removing wires, will keep CT Pulm: pulm toilet Renal: diuresing today GI: advancing diet Heme: stable ID: [email or web address] Endo: on SSI Dispo: floor  Melodie Bouillon, MD 08/16/2019 9:39 AM

## 2019-08-16 NOTE — Plan of Care (Signed)
  Problem: Education: Goal: Knowledge of General Education information will improve Description Including pain rating scale, medication(s)/side effects and non-pharmacologic comfort measures Outcome: Progressing   Problem: Health Behavior/Discharge Planning: Goal: Ability to manage health-related needs will improve Outcome: Progressing   

## 2019-08-17 LAB — GLUCOSE, CAPILLARY
Glucose-Capillary: 123 mg/dL — ABNORMAL HIGH (ref 70–99)
Glucose-Capillary: 116 mg/dL — ABNORMAL HIGH (ref 70–99)
Glucose-Capillary: 125 mg/dL — ABNORMAL HIGH (ref 70–99)
Glucose-Capillary: 106 mg/dL — ABNORMAL HIGH (ref 70–99)

## 2019-08-17 MED ORDER — METFORMIN HCL 850 MG PO TABS
850.0000 mg | ORAL_TABLET | Freq: Two times a day (BID) | ORAL | Status: DC
  Administered 2019-08-17 – 2019-08-19 (×5): 850 mg via ORAL
  Filled 2019-08-17 (×5): qty 1

## 2019-08-17 NOTE — Progress Notes (Signed)
Foley removed as ordered. Patient tolerated well. Bryony Kaman, Bettina Gavia RN

## 2019-08-17 NOTE — Progress Notes (Signed)
Patient ambulated in hallway with nursing staff and rolling walker. Walked with oxygen at 2 L. Patient wanted to go back to bed after walk. Placed in bed call bell with in reach. Maurisio Ruddy, Bettina Gavia RN

## 2019-08-17 NOTE — Progress Notes (Addendum)
[address]       [place] [address]             [phone]      3 Days Post-Op Procedure(s) (LRB): CORONARY ARTERY BYPASS GRAFTING (CABG) endoscopic right saphenous vein harvesting to Diag1 and Om1; LIMA to (m)LAD; RIMA to PDA. (N/A) TRANSESOPHAGEAL ECHOCARDIOGRAM (TEE) (N/A) Subjective: Feels pretty well, no specific c/o  Objective: Vital signs in last 24 hours: Temp:  [96.7 F (35.9 C)-98.5 F (36.9 C)] 98.5 F (36.9 C) (09/27 0805) Pulse Rate:  [75-83] 78 (09/27 0805) Cardiac Rhythm: Normal sinus rhythm (09/27 0200) Resp:  [12-24] 17 (09/27 0805) BP: (118-136)/(56-69) 123/63 (09/27 0805) SpO2:  [94 %-100 %] 98 % (09/27 0805) Weight:  [94.6 kg] 94.6 kg (09/27 0500)  Hemodynamic parameters for last 24 hours:    Intake/Output from previous day: 09/26 0701 - 09/27 0700 In: 0  Out: 1295 [Urine:1245; Chest Tube:50] Intake/Output this shift: No intake/output data recorded.  General appearance: alert, cooperative and no distress Heart: regular rate and rhythm Lungs: min dim in bases Abdomen: benign Extremities: no edema Wound: evh site ok, chest dressing CDI  Lab Results: Recent Labs    08/15/19 1650 08/16/19 0418  WBC 10.4 10.0  HGB 8.0* 8.5*  HCT 24.6* 26.0*  PLT 108* 121*   BMET:  Recent Labs    08/15/19 1650 08/16/19 0418  NA 137 136  K 4.0 3.9  CL 106 104  CO2 21* 24  GLUCOSE 128* 118*  BUN 21 19  CREATININE 1.35* 1.18  CALCIUM 7.7* 8.0*    PT/INR:  Recent Labs    08/14/19 1326  LABPROT 16.3*  INR 1.3*   ABG    Component Value Date/Time   PHART 7.383 08/14/2019 1837   HCO3 22.1 08/14/2019 1837   TCO2 23 08/14/2019 1837   ACIDBASEDEF 3.0 (H) 08/14/2019 1837   O2SAT 99.0 08/14/2019 1837   CBG (last 3)  Recent Labs    08/16/19 0815 08/16/19 1154 08/17/19 0633  GLUCAP 128* 129* 123*    Meds Scheduled Meds: . acetaminophen  1,000 mg Oral Q6H   Or  . acetaminophen (TYLENOL) oral liquid 160 mg/5 mL   1,000 mg Per Tube Q6H  . aspirin EC  325 mg Oral Daily   Or  . aspirin  324 mg Per Tube Daily  . bisacodyl  10 mg Oral Daily   Or  . bisacodyl  10 mg Rectal Daily  . Chlorhexidine Gluconate Cloth  6 each Topical Daily  . colchicine  0.6 mg Oral Daily  . docusate sodium  200 mg Oral Daily  . ferrous Q000111Q C-folic acid  1 capsule Oral BID PC  . furosemide  40 mg Oral Daily  . insulin aspart  0-24 Units Subcutaneous TID AC & HS  . magnesium oxide  400 mg Oral BID  . mouth rinse  15 mL Mouth Rinse BID  . metoprolol tartrate  12.5 mg Oral BID   Or  . metoprolol tartrate  12.5 mg Per Tube BID  . pantoprazole  40 mg Oral Daily  . potassium chloride  40 mEq Oral Daily  . rosuvastatin  10 mg Oral q1800  . sertraline  100 mg Oral Daily  . sodium chloride flush  10-40 mL Intracatheter Q12H  . sodium chloride flush  3 mL Intravenous Q12H   Continuous Infusions: . sodium chloride Stopped (08/14/19 1710)  . sodium chloride    . sodium chloride 10  mL/hr at 08/14/19 1402  . lactated ringers    . lactated ringers Stopped (08/15/19 1315)   PRN Meds:.sodium chloride, metoprolol tartrate, morphine injection, ondansetron (ZOFRAN) IV, oxyCODONE, sodium chloride flush, traMADol  Xrays Dg Chest Port 1 View  Result Date: 08/16/2019 CLINICAL DATA:  Status post CABG. EXAM: PORTABLE CHEST 1 VIEW COMPARISON:  08/15/2019 FINDINGS: Swan-Ganz catheter has been removed. Bilateral chest tubes and a mediastinal drain remain with no evidence of pneumothorax. Lung volumes remain low bilaterally with slight improvement in bibasilar atelectasis. No edema or significant pleural effusions. Stable cardiac and mediastinal contours. Less prominent gaseous distention of the stomach. IMPRESSION: Low lung volumes with slight improvement in bibasilar atelectasis. No pneumothorax. Electronically Signed   By: Aletta Edouard M.D.   On: 08/16/2019 08:48    Assessment/Plan: S/P Procedure(s) (LRB): CORONARY  ARTERY BYPASS GRAFTING (CABG) endoscopic right saphenous vein harvesting to Diag1 and Om1; LIMA to (m)LAD; RIMA to PDA. (N/A) TRANSESOPHAGEAL ECHOCARDIOGRAM (TEE) (N/A)  1 doing well 2 hemodyn stable in sinus rhythm, no fevers 3 sats good on 2liters 4 no new CXR/labs today- will recheck in am 5 CT drainage low- d/c tubes today 6 d/c foley today 7 BS well controlled- will resume metformin 8 routine pulm toilet /rehab   LOS: 4 days    John Giovanni [facility] 08/17/2019 Pager [phone]  Agree with above Doing well Will remove CT Ambulation, pulm toilet  Ytzel Gubler O Aikeem Lilley

## 2019-08-17 NOTE — Progress Notes (Signed)
Patient assisted to chair this AM, call bell with in reach. Synthia Fairbank, Bettina Gavia RN

## 2019-08-17 NOTE — Plan of Care (Signed)
  Problem: Education: Goal: Knowledge of General Education information will improve Description Including pain rating scale, medication(s)/side effects and non-pharmacologic comfort measures Outcome: Progressing   Problem: Health Behavior/Discharge Planning: Goal: Ability to manage health-related needs will improve Outcome: Progressing   

## 2019-08-18 LAB — GLUCOSE, CAPILLARY
Glucose-Capillary: 116 mg/dL — ABNORMAL HIGH (ref 70–99)
Glucose-Capillary: 112 mg/dL — ABNORMAL HIGH (ref 70–99)
Glucose-Capillary: 127 mg/dL — ABNORMAL HIGH (ref 70–99)
Glucose-Capillary: 114 mg/dL — ABNORMAL HIGH (ref 70–99)
Glucose-Capillary: 142 mg/dL — ABNORMAL HIGH (ref 70–99)

## 2019-08-18 LAB — CBC
HCT: 26 % — ABNORMAL LOW (ref 39.0–52.0)
Hemoglobin: 8.5 g/dL — ABNORMAL LOW (ref 13.0–17.0)
MCH: 28.4 pg (ref 26.0–34.0)
MCHC: 32.7 g/dL (ref 30.0–36.0)
MCV: 87 fL (ref 80.0–100.0)
Platelets: 143 10*3/uL — ABNORMAL LOW (ref 150–400)
RBC: 2.99 MIL/uL — ABNORMAL LOW (ref 4.22–5.81)
RDW: 14.2 % (ref 11.5–15.5)
WBC: 7.5 10*3/uL (ref 4.0–10.5)
nRBC: 0 % (ref 0.0–0.2)

## 2019-08-18 LAB — BASIC METABOLIC PANEL
Anion gap: 14 (ref 5–15)
BUN: 15 mg/dL (ref 8–23)
CO2: 25 mmol/L (ref 22–32)
Calcium: 8.4 mg/dL — ABNORMAL LOW (ref 8.9–10.3)
Chloride: 99 mmol/L (ref 98–111)
Creatinine, Ser: 0.99 mg/dL (ref 0.61–1.24)
GFR calc Af Amer: >60 mL/min (ref >60)
GFR calc non Af Amer: >60 mL/min (ref >60)
Glucose, Bld: 123 mg/dL — ABNORMAL HIGH (ref 70–99)
Potassium: 3.8 mmol/L (ref 3.5–5.1)
Sodium: 138 mmol/L (ref 135–145)

## 2019-08-18 MED ORDER — METOPROLOL TARTRATE 25 MG/10 ML ORAL SUSPENSION
12.5000 mg | Freq: Two times a day (BID) | ORAL | Status: DC
  Filled 2019-08-18 (×3): qty 5

## 2019-08-18 MED ORDER — METOPROLOL TARTRATE 25 MG PO TABS
25.0000 mg | ORAL_TABLET | Freq: Two times a day (BID) | ORAL | Status: DC
  Administered 2019-08-18 – 2019-08-19 (×3): 25 mg via ORAL
  Filled 2019-08-18 (×3): qty 1

## 2019-08-18 MED FILL — Sodium Bicarbonate IV Soln 8.4%: INTRAVENOUS | Qty: 50 | Status: AC

## 2019-08-18 MED FILL — Mannitol IV Soln 20%: INTRAVENOUS | Qty: 500 | Status: AC

## 2019-08-18 MED FILL — Magnesium Sulfate Inj 50%: INTRAMUSCULAR | Qty: 10 | Status: AC

## 2019-08-18 MED FILL — Electrolyte-R (PH 7.4) Solution: INTRAVENOUS | Qty: 4000 | Status: AC

## 2019-08-18 MED FILL — Potassium Chloride Inj 2 mEq/ML: INTRAVENOUS | Qty: 40 | Status: AC

## 2019-08-18 MED FILL — Albumin, Human Inj 5%: INTRAVENOUS | Qty: 250 | Status: AC

## 2019-08-18 MED FILL — Heparin Sodium (Porcine) Inj 1000 Unit/ML: INTRAMUSCULAR | Qty: 30 | Status: AC

## 2019-08-18 MED FILL — Sodium Chloride IV Soln 0.9%: INTRAVENOUS | Qty: 2000 | Status: AC

## 2019-08-18 MED FILL — Heparin Sodium (Porcine) Inj 1000 Unit/ML: INTRAMUSCULAR | Qty: 10 | Status: AC

## 2019-08-18 MED FILL — Lidocaine HCl Local Soln Prefilled Syringe 100 MG/5ML (2%): INTRAMUSCULAR | Qty: 5 | Status: AC

## 2019-08-18 NOTE — Progress Notes (Addendum)
[address]       [place] [address]             [phone]      4 Days Post-Op Procedure(s) (LRB): CORONARY ARTERY BYPASS GRAFTING (CABG) endoscopic right saphenous vein harvesting to Diag1 and Om1; LIMA to (m)LAD; RIMA to PDA. (N/A) TRANSESOPHAGEAL ECHOCARDIOGRAM (TEE) (N/A) Subjective: Anxious and somewhat depressed  Objective: Vital signs in last 24 hours: Temp:  [98 F (36.7 C)-98.5 F (36.9 C)] 98 F (36.7 C) (09/28 0355) Pulse Rate:  [77-104] 87 (09/28 0612) Cardiac Rhythm: Supraventricular tachycardia (09/28 0315) Resp:  [11-22] 21 (09/28 0612) BP: (123-157)/(62-79) 157/79 (09/28 0355) SpO2:  [92 %-98 %] 98 % (09/28 0612) Weight:  [92 kg] 92 kg (09/28 0612)  Hemodynamic parameters for last 24 hours:    Intake/Output from previous day: 09/27 0701 - 09/28 0700 In: 360 [P.O.:360] Out: 1680 [Urine:1680] Intake/Output this shift: No intake/output data recorded.  General appearance: alert, cooperative and no distress Heart: regular rate and rhythm Lungs: clear to auscultation bilaterally Abdomen: benign Extremities: no edema Wound: incis healing well  Lab Results: Recent Labs    08/15/19 1650 08/16/19 0418  WBC 10.4 10.0  HGB 8.0* 8.5*  HCT 24.6* 26.0*  PLT 108* 121*   BMET:  Recent Labs    08/15/19 1650 08/16/19 0418  NA 137 136  K 4.0 3.9  CL 106 104  CO2 21* 24  GLUCOSE 128* 118*  BUN 21 19  CREATININE 1.35* 1.18  CALCIUM 7.7* 8.0*    PT/INR: No results for input(s): LABPROT, INR in the last 72 hours. ABG    Component Value Date/Time   PHART 7.383 08/14/2019 1837   HCO3 22.1 08/14/2019 1837   TCO2 23 08/14/2019 1837   ACIDBASEDEF 3.0 (H) 08/14/2019 1837   O2SAT 99.0 08/14/2019 1837   CBG (last 3)  Recent Labs    08/17/19 1605 08/17/19 2047 08/18/19 0611  GLUCAP 125* 106* 112*    Meds Scheduled Meds: . acetaminophen  1,000 mg Oral Q6H   Or  . acetaminophen (TYLENOL) oral liquid 160 mg/5 mL  1,000  mg Per Tube Q6H  . aspirin EC  325 mg Oral Daily   Or  . aspirin  324 mg Per Tube Daily  . bisacodyl  10 mg Oral Daily   Or  . bisacodyl  10 mg Rectal Daily  . Chlorhexidine Gluconate Cloth  6 each Topical Daily  . colchicine  0.6 mg Oral Daily  . docusate sodium  200 mg Oral Daily  . ferrous Q000111Q C-folic acid  1 capsule Oral BID PC  . furosemide  40 mg Oral Daily  . insulin aspart  0-24 Units Subcutaneous TID AC & HS  . magnesium oxide  400 mg Oral BID  . mouth rinse  15 mL Mouth Rinse BID  . metFORMIN  850 mg Oral BID WC  . metoprolol tartrate  12.5 mg Oral BID   Or  . metoprolol tartrate  12.5 mg Per Tube BID  . pantoprazole  40 mg Oral Daily  . potassium chloride  40 mEq Oral Daily  . rosuvastatin  10 mg Oral q1800  . sertraline  100 mg Oral Daily  . sodium chloride flush  3 mL Intravenous Q12H   Continuous Infusions: . sodium chloride Stopped (08/14/19 1710)  . sodium chloride    . sodium chloride 10 mL/hr at 08/14/19 1402  . lactated ringers    . lactated ringers  Stopped (08/15/19 1315)   PRN Meds:.sodium chloride, metoprolol tartrate, morphine injection, ondansetron (ZOFRAN) IV, oxyCODONE, sodium chloride flush, traMADol  Xrays No results found.  Assessment/Plan: S/P Procedure(s) (LRB): CORONARY ARTERY BYPASS GRAFTING (CABG) endoscopic right saphenous vein harvesting to Diag1 and Om1; LIMA to (m)LAD; RIMA to PDA. (N/A) TRANSESOPHAGEAL ECHOCARDIOGRAM (TEE) (N/A)  1 doing well 2 hemodyn stable with short burst of SVT- will increase metoprolol 3 no new labs or CXR 4 BS well controlled back on metformin 5 push rehab and routine pulm toilet  LOS: 5 days    John Giovanni [facility] 08/18/2019 Pager [phone]  Pt seen and examined; I agree with the note and plan as written. Anticipate discharge tomorrow.   Tryson Lumley Z. [name], [place]

## 2019-08-18 NOTE — Progress Notes (Signed)
CARDIAC REHAB PHASE I   PRE:  Rate/Rhythm: 81 SR    BP: lying 143/70    SaO2: 87-88 RA  MODE:  Ambulation: 470 ft   POST:  Rate/Rhythm: 98 SR    BP: sitting 142/73     SaO2: 94 RA  Pt asleep on my arrival, somewhat groggy. SaO2 87-88 RA. Increased with sitting up. Able to walk with RW, fairly independently. SaO2 91-94 RA walking. To recliner. Practiced IS, 1200 mL. He has a RW at home. Daughter present. Encouraged x2 more walks today. Staff will need to access SaO2 tonight while asleep. [id number]   [place], ACSM 08/18/2019 11:21 AM

## 2019-08-19 ENCOUNTER — Inpatient Hospital Stay (HOSPITAL_COMMUNITY): Payer: HMO

## 2019-08-19 LAB — GLUCOSE, CAPILLARY: Glucose-Capillary: 116 mg/dL — ABNORMAL HIGH (ref 70–99)

## 2019-08-19 MED ORDER — HYDROCHLOROTHIAZIDE 25 MG PO TABS: 25.0000 mg | ORAL_TABLET | Freq: Every day | ORAL | 0 refills | Status: DC

## 2019-08-19 MED ORDER — TRAMADOL HCL 50 MG PO TABS: 50.0000 mg | ORAL_TABLET | Freq: Four times a day (QID) | ORAL | 0 refills | Status: DC | PRN

## 2019-08-19 MED ORDER — METOPROLOL TARTRATE 25 MG PO TABS: 25.0000 mg | ORAL_TABLET | Freq: Two times a day (BID) | ORAL | 1 refills | Status: DC

## 2019-08-19 MED ORDER — ROSUVASTATIN CALCIUM 20 MG PO TABS: 10.0000 mg | ORAL_TABLET | Freq: Every day | ORAL | 2 refills | Status: DC

## 2019-08-19 MED ORDER — FE FUMARATE-B12-VIT C-FA-IFC PO CAPS: 1.0000 | ORAL_CAPSULE | Freq: Two times a day (BID) | ORAL | 1 refills | Status: DC

## 2019-08-19 MED ORDER — IRBESARTAN 150 MG PO TABS
150.0000 mg | ORAL_TABLET | Freq: Every day | ORAL | Status: DC
  Administered 2019-08-19: 150 mg via ORAL
  Filled 2019-08-19: qty 1

## 2019-08-19 MED ORDER — ASPIRIN 325 MG PO TBEC: 325.0000 mg | DELAYED_RELEASE_TABLET | Freq: Every day | ORAL | Status: DC

## 2019-08-19 MED FILL — traMADol HCL 50 MG TABS: 50 | 4 days supply | Qty: 28 | Fill #0

## 2019-08-19 MED FILL — FEROCON CAPSULE: 30 days supply | Qty: 60 | Fill #0

## 2019-08-19 MED FILL — METOPROLOL TARTRATE 25 MG T: 25 | 30 days supply | Qty: 60 | Fill #0

## 2019-08-19 MED FILL — ROSUVASTATIN CALCIUM 20 MG: 20 | 15 days supply | Qty: 15 | Fill #0

## 2019-08-19 NOTE — Progress Notes (Signed)
[address]       [place] [address]             [phone]      5 Days Post-Op Procedure(s) (LRB): CORONARY ARTERY BYPASS GRAFTING (CABG) endoscopic right saphenous vein harvesting to Diag1 and Om1; LIMA to (m)LAD; RIMA to PDA. (N/A) TRANSESOPHAGEAL ECHOCARDIOGRAM (TEE) (N/A) Subjective: Feels well  Objective: Vital signs in last 24 hours: Temp:  [97.4 F (36.3 C)-98.3 F (36.8 C)] 97.4 F (36.3 C) (09/29 0448) Pulse Rate:  [83-94] 94 (09/28 2019) Cardiac Rhythm: Normal sinus rhythm (09/28 1949) Resp:  [15-20] 19 (09/29 0448) BP: (133-173)/(63-81) 173/81 (09/29 0448) SpO2:  [92 %-95 %] 95 % (09/29 0448) Weight:  [89.6 kg] 89.6 kg (09/29 0448)  Hemodynamic parameters for last 24 hours:    Intake/Output from previous day: 09/28 0701 - 09/29 0700 In: 120 [P.O.:120] Out: -  Intake/Output this shift: No intake/output data recorded.  General appearance: alert, cooperative and no distress Heart: regular rate and rhythm Lungs: min dim in left base Abdomen: benign Extremities: no edema Wound: incis healing well  Lab Results: Recent Labs    08/18/19 0824  WBC 7.5  HGB 8.5*  HCT 26.0*  PLT 143*   BMET:  Recent Labs    08/18/19 0824  NA 138  K 3.8  CL 99  CO2 25  GLUCOSE 123*  BUN 15  CREATININE 0.99  CALCIUM 8.4*    PT/INR: No results for input(s): LABPROT, INR in the last 72 hours. ABG    Component Value Date/Time   PHART 7.383 08/14/2019 1837   HCO3 22.1 08/14/2019 1837   TCO2 23 08/14/2019 1837   ACIDBASEDEF 3.0 (H) 08/14/2019 1837   O2SAT 99.0 08/14/2019 1837   CBG (last 3)  Recent Labs    08/18/19 1613 08/18/19 2220 08/19/19 0618  GLUCAP 114* 142* 116*    Meds Scheduled Meds: . acetaminophen  1,000 mg Oral Q6H   Or  . acetaminophen (TYLENOL) oral liquid 160 mg/5 mL  1,000 mg Per Tube Q6H  . aspirin EC  325 mg Oral Daily   Or  . aspirin  324 mg Per Tube Daily  . bisacodyl  10 mg Oral Daily   Or  .  bisacodyl  10 mg Rectal Daily  . Chlorhexidine Gluconate Cloth  6 each Topical Daily  . colchicine  0.6 mg Oral Daily  . docusate sodium  200 mg Oral Daily  . ferrous Q000111Q C-folic acid  1 capsule Oral BID PC  . furosemide  40 mg Oral Daily  . insulin aspart  0-24 Units Subcutaneous TID AC & HS  . magnesium oxide  400 mg Oral BID  . mouth rinse  15 mL Mouth Rinse BID  . metFORMIN  850 mg Oral BID WC  . metoprolol tartrate  25 mg Oral BID   Or  . metoprolol tartrate  12.5 mg Per Tube BID  . pantoprazole  40 mg Oral Daily  . potassium chloride  40 mEq Oral Daily  . rosuvastatin  10 mg Oral q1800  . sertraline  100 mg Oral Daily  . sodium chloride flush  3 mL Intravenous Q12H   Continuous Infusions: . sodium chloride Stopped (08/14/19 1710)  . sodium chloride    . sodium chloride 10 mL/hr at 08/14/19 1402  . lactated ringers    . lactated ringers Stopped (08/15/19 1315)   PRN Meds:.sodium chloride, metoprolol tartrate, morphine injection, ondansetron (ZOFRAN) IV, oxyCODONE,  sodium chloride flush, traMADol  Xrays No results found.  Assessment/Plan: S/P Procedure(s) (LRB): CORONARY ARTERY BYPASS GRAFTING (CABG) endoscopic right saphenous vein harvesting to Diag1 and Om1; LIMA to (m)LAD; RIMA to PDA. (N/A) TRANSESOPHAGEAL ECHOCARDIOGRAM (TEE) (N/A)  1 hemodyn stable, but more hypertensive, will resume benicar.morcasc and HCTZ.  No fevers 2 sats good on RA 3 CXR, small left effusion 4 sinus rhythm 5 stable for discharge   LOS: 6 days    John Giovanni [facility] 08/19/2019 Pager [phone]

## 2019-08-19 NOTE — Progress Notes (Signed)
In report, RN was told lately pt has been having some delirium overnight the past weekend req daughter to be at bedside to calm. This evening pt is still quite anxious and paranoid, he attributes this mostly to lack of sleep and also unfamiliar environment. RN went to check for midnight vitals, pt is asleep and daughter requests to let him rest. His 8pm vitals were stable BP 159/75, HR 90s NSR, O2 92-95% on room air. Will get next set at 4am per request.

## 2019-08-19 NOTE — Progress Notes (Signed)
Discharge instructions given to patient. Medications and wound care reviewed. All questions answered. IV's removed, clean and intact. Chest tube sutures removed per order. Daughter to escort patient home.  Arletta Bale, RN

## 2019-08-19 NOTE — Progress Notes (Signed)
CARDIAC REHAB PHASE I   Ed completed with pt and daughter. Good reception. He is getting a RW. Will refer to Van. Set up d/c video. [id number] Pt is interested in participating in Virtual Cardiac and Pulmonary Rehab. Pt advised that Virtual Cardiac and Pulmonary Rehab is provided at no cost to the patient.  Checklist:  1. Pt has smart device  ie smartphone and/or ipad for downloading an app  Yes 2. Reliable internet/wifi service    Yes 3. Understands how to use their smartphone and navigate within an app.  Yesy Pt verbalized understanding and is in agreement.  [place], ACSM 08/19/2019 9:53 AM

## 2019-08-20 ENCOUNTER — Other Ambulatory Visit: Payer: Self-pay

## 2019-08-20 NOTE — Patient Outreach (Signed)
  [facility]) Care Management Chronic Special Needs Program   08/20/2019  Name: Timothy, [name]: 14-Feb-1941  MRN: [id number]  The client was discussed in today's interdisciplinary care team meeting.  The following issues were discussed:  Changes in health status, Care Plan, Coordination of care and Care transitions  Participants present:    Thea Silversmith, MSN, RN, CCM   Mika Griffitts RN, BSN, CCM, CDE     Marco Collie, MD Maryella Shivers, MD  Kelli Churn, RN, CCM, CDE Gilda Crease, PharmD, RPh Bary Castilla, RN, BSN, MS, CCM Coralie Carpen, MD Quinn Plowman RN, BSN, CCM  Recommendations:   Transition of care call to be completed by [facility] utilization management team and RNCM will follow up per their recommendations  Plan:  Send outreach letter with a copy of their individualized care plan and Send individual care plan to provider  Follow-up:  One month or sooner as needed  [name] RN, Jackquline Denmark, CDE Chronic Care Management Coordinator [facility] [phone]

## 2019-08-21 ENCOUNTER — Encounter (HOSPITAL_COMMUNITY): Payer: Self-pay | Admitting: Cardiology

## 2019-08-22 ENCOUNTER — Ambulatory Visit: Payer: HMO | Admitting: Cardiology

## 2019-08-29 ENCOUNTER — Ambulatory Visit: Payer: Self-pay | Admitting: Cardiothoracic Surgery

## 2019-09-05 ENCOUNTER — Ambulatory Visit (INDEPENDENT_AMBULATORY_CARE_PROVIDER_SITE_OTHER): Payer: Self-pay | Admitting: Cardiothoracic Surgery

## 2019-09-05 ENCOUNTER — Other Ambulatory Visit: Payer: Self-pay | Admitting: Cardiothoracic Surgery

## 2019-09-05 ENCOUNTER — Ambulatory Visit
Admission: RE | Admit: 2019-09-05 | Discharge: 2019-09-05 | Disposition: A | Discharge status: Home / Self Care | Payer: HMO | Source: Ambulatory Visit | Attending: Cardiothoracic Surgery | Admitting: Cardiothoracic Surgery

## 2019-09-05 ENCOUNTER — Other Ambulatory Visit: Payer: Self-pay

## 2019-09-05 VITALS — BP 128/70 | HR 99 | Temp 97.3°F | Resp 16 | Wt 194.4 lb

## 2019-09-05 DIAGNOSIS — Z951 Presence of aortocoronary bypass graft: Secondary | ICD-10-CM

## 2019-09-05 DIAGNOSIS — I2510 Atherosclerotic heart disease of native coronary artery without angina pectoris: Secondary | ICD-10-CM

## 2019-09-08 ENCOUNTER — Telehealth (HOSPITAL_COMMUNITY): Payer: Self-pay | Admitting: *Deleted

## 2019-09-08 NOTE — Progress Notes (Signed)
[address]       [place] [address]             [phone]     CARDIOTHORACIC SURGERY OFFICE NOTE  Referring Provider is Patwardhan, Reynold Bowen, MD Primary Cardiologist is No primary care provider on file. PCP is Tisovec, Fransico Him, MD   HPI:  78 yo man s/p CABG 08/14/19 presents for 1st visit after discharge. Doing well without complaints; walking several times per day. No angina, no shortness of breath. Notes that his legs "give out" with extended exertion--felt related to deconditioning.   Current Outpatient Medications  Medication Sig Dispense Refill  . amLODipine (NORVASC) 10 MG tablet Take 10 mg by mouth at bedtime.    Marland Kitchen aspirin EC 325 MG EC tablet Take 1 tablet (325 mg total) by mouth daily.    . ferrous Q000111Q C-folic acid (TRINSICON / FOLTRIN) capsule Take 1 capsule by mouth 2 (two) times daily after a meal. 60 capsule 1  . hydrochlorothiazide (HYDRODIURIL) 25 MG tablet Take 1 tablet (25 mg total) by mouth daily. 30 tablet 0  . metFORMIN (GLUCOPHAGE) 850 MG tablet Take 850 mg by mouth 2 (two) times daily with a meal.    . metoprolol tartrate (LOPRESSOR) 25 MG tablet Take 1 tablet (25 mg total) by mouth 2 (two) times daily. 60 tablet 1  . olmesartan (BENICAR) 40 MG tablet Take 40 mg by mouth daily.    Marland Kitchen omeprazole (PRILOSEC) 20 MG capsule Take 1 capsule (20 mg total) by mouth 2 (two) times daily. Take on an empty stomach 20 - 30 minutes prior to food. (Patient taking differently: Take 20 mg by mouth daily. Take on an empty stomach 20 - 30 minutes prior to food.) 180 capsule 3  . rosuvastatin (CRESTOR) 20 MG tablet Take 0.5 tablets (10 mg total) by mouth daily at 6 PM. 30 tablet 2  . sertraline (ZOLOFT) 100 MG tablet Take 100 mg by mouth daily.    . traMADol (ULTRAM) 50 MG tablet Take 1-2 tablets (50-100 mg total) by mouth every 6 (six) hours as needed for moderate pain. 28 tablet 0   No current facility-administered medications for this  visit.       Physical Exam:   BP 128/70 (BP Location: Right Arm)   Pulse 99   Temp (!) 97.3 F (36.3 C) (Skin)   Resp 16   Wt 88.2 kg   SpO2 95% Comment: RA  BMI 31.38 kg/m   General:  Well-appearing, NAD  Chest:   cta  CV:   rrr  Incisions:  Well-healed  Abdomen:  Nt, nd  Extremities:  No edema  Diagnostic Tests:  CXR with clear lung fields   Impression:  Doing well after CABG  Plan:  F/u with CT surgery as needed F/u with Dr. Virgina Jock as scheduled.   I spent in excess of 15 minutes during the conduct of this office consultation and >50% of this time involved direct face-to-face encounter with the patient for counseling and/or coordination of their care.  Level 2                 10 minutes Level 3                 15 minutes Level 4                 25 minutes Level 5  40 minutes  B. Murvin Natal, MD 09/08/2019 7:26 AM

## 2019-09-08 NOTE — Telephone Encounter (Signed)
-----   Message from Wonda Olds, MD sent at 09/08/2019  1:22 PM EDT ----- Regarding: RE: Ok to participate in group exercise at cardiac rheab with high Covid risk score -7 Hi. Yes, he is very motivated and I think he'll get a lot out of your program. Thanks for facilitating. Murvin Natal ----- Message ----- From: Rowe Pavy, RN Sent: 09/08/2019   9:00 AM EDT To: Wonda Olds, MD Subject: Ok to participate in group exercise at cardi#  Greetings Dr. [name]  We are seeing patients on-site for cardiac rehab . A great deal of planning with advisement from our Medical Director - Dr. Radford Pax, CV Service line leadership, Infection Disease Control, Facilities, security, recommendations from American Association of Cardiac and Pulmonary Rehab (AACVPR) with the goal for optimal patient safety. Patients will have strict guidelines and criteria they must adhere to and follow. Patients will wear a mask during exercise and practice social distancing. Patients will have to complete screening prior to entry into gym area.   Your patient expressed great interest in participating in facility cardiac rehab. Patient has a high COVID-19 risk score of 7. Do you feel this patient is appropriate to exercise in facility cardiac rehab? Any additional restrictions you feel are appropriate for this patient?  Pt completed his follow up with you on  10/16 and will see Dr. Virgina Jock  on 10/22.   Thank you and we appreciate your input  Maurice Small RN, BSN Cardiac and Pulmonary Rehab Nurse Navigator   Cardiac Rehab Staff

## 2019-09-09 ENCOUNTER — Telehealth (HOSPITAL_COMMUNITY): Payer: Self-pay

## 2019-09-09 ENCOUNTER — Encounter (HOSPITAL_COMMUNITY): Payer: Self-pay

## 2019-09-09 NOTE — Telephone Encounter (Signed)
Attempted to call patient in regards to Cardiac Rehab - LM on VM Mailed letter 

## 2019-09-11 ENCOUNTER — Other Ambulatory Visit: Payer: Self-pay

## 2019-09-11 ENCOUNTER — Encounter: Payer: Self-pay | Admitting: Cardiology

## 2019-09-11 ENCOUNTER — Ambulatory Visit (INDEPENDENT_AMBULATORY_CARE_PROVIDER_SITE_OTHER): Payer: HMO | Admitting: Cardiology

## 2019-09-11 VITALS — BP 128/70 | HR 86 | Temp 97.7°F | Ht 66.0 in | Wt 197.0 lb

## 2019-09-11 DIAGNOSIS — I25810 Atherosclerosis of coronary artery bypass graft(s) without angina pectoris: Secondary | ICD-10-CM

## 2019-09-11 DIAGNOSIS — I10 Essential (primary) hypertension: Secondary | ICD-10-CM

## 2019-09-11 DIAGNOSIS — G4700 Insomnia, unspecified: Secondary | ICD-10-CM | POA: Insufficient documentation

## 2019-09-11 MED ORDER — MELATONIN 5 MG PO CAPS: 5.0000 mg | ORAL_CAPSULE | Freq: Every day | ORAL | 3 refills | Status: DC

## 2019-09-11 MED ORDER — ASPIRIN EC 81 MG PO TBEC: 81.0000 mg | DELAYED_RELEASE_TABLET | Freq: Every day | ORAL | 3 refills | Status: DC

## 2019-09-11 MED ORDER — XARELTO 2.5 MG PO TABS: 2.5000 mg | ORAL_TABLET | Freq: Two times a day (BID) | ORAL | 3 refills | Status: DC

## 2019-09-11 NOTE — Progress Notes (Signed)
Patient is here for follow up visit.  Subjective:   Timothy [name], male    DOB: 1941/08/07, 78 y.o.   MRN: [id number]   Chief Complaint  Patient presents with  . Chest Pain  . Follow-up    cath/bypass    78 y/o Caucasian male with hypertension, type 2 DM, former smoker, CSD s/p CABGX4 (LIMA-LAD< RIMA-PDA, SVG-OM, SVG-Diag) Dr [name] 08/14/2019.  Patient has had significant improvement in his his chest pain and shortness of breath since CABG. However, he has had difficulty sleeping and generalized fatigue and feeling depressed.   Past Medical History:  Diagnosis Date  . Diabetes mellitus without complication ([place])   . Elevated cholesterol   . GERD (gastroesophageal reflux disease)   . Hyperplastic colonic polyp    2010  . Hypertension   . Sleep apnea    no cpap  . Tubular adenoma of colon    2010     Past Surgical History:  Procedure Laterality Date  . CARPAL TUNNEL RELEASE Right 09/11/2017   Procedure: RIGHT CARPAL TUNNEL RELEASE;  Surgeon: Daryll Brod, MD;  Location: [facility];  Service: Orthopedics;  Laterality: Right;  . CORONARY ARTERY BYPASS GRAFT N/A 08/14/2019   Procedure: CORONARY ARTERY BYPASS GRAFTING (CABG) endoscopic right saphenous vein harvesting to Diag1 and Om1; LIMA to (m)LAD; RIMA to PDA.;  Surgeon: Wonda Olds, MD;  Location: [facility];  Service: Open Heart Surgery;  Laterality: N/A;  . LEFT HEART CATH AND CORONARY ANGIOGRAPHY N/A 08/12/2019   Procedure: LEFT HEART CATH AND CORONARY ANGIOGRAPHY;  Surgeon: [name], MD;  Location: [facility];  Service: Cardiovascular;  Laterality: N/A;  . ROTATOR CUFF REPAIR    . TEE WITHOUT CARDIOVERSION N/A 08/14/2019   Procedure: TRANSESOPHAGEAL ECHOCARDIOGRAM (TEE);  Surgeon: Wonda Olds, MD;  Location: [facility];  Service: Open Heart Surgery;  Laterality: N/A;     Social History   Socioeconomic History  . Marital status: Widowed    Spouse name: Not on file  . Number  of children: 2  . Years of education: Not on file  . Highest education level: Not on file  Occupational History  . Occupation: Retired  Scientific laboratory technician  . Financial resource strain: Not on file  . Food insecurity    Worry: Never true    Inability: Never true  . Transportation needs    Medical: No    Non-medical: No  Tobacco Use  . Smoking status: Former Smoker    Packs/day: 1.00    Types: Cigarettes    Quit date: 1975    Years since quitting: 45.8  . Smokeless tobacco: Never Used  Substance and Sexual Activity  . Alcohol use: Yes    Comment: occasional  . Drug use: No  . Sexual activity: Not on file  Lifestyle  . Physical activity    Days per week: Not on file    Minutes per session: Not on file  . Stress: Not on file  Relationships  . Social Herbalist on phone: Not on file    Gets together: Not on file    Attends religious service: Not on file    Active member of club or organization: Not on file    Attends meetings of clubs or organizations: Not on file    Relationship status: Not on file  . Intimate partner violence    Fear of current or ex partner: Not on file    Emotionally  abused: Not on file    Physically abused: Not on file    Forced sexual activity: Not on file  Other Topics Concern  . Not on file  Social History Narrative  . Not on file     Current Outpatient Medications on File Prior to Visit  Medication Sig Dispense Refill  . amLODipine (NORVASC) 10 MG tablet Take 10 mg by mouth at bedtime.    Marland Kitchen aspirin EC 325 MG EC tablet Take 1 tablet (325 mg total) by mouth daily.    . ferrous [id number] C-folic acid (TRINSICON / FOLTRIN) capsule Take 1 capsule by mouth 2 (two) times daily after a meal. 60 capsule 1  . hydrochlorothiazide (HYDRODIURIL) 25 MG tablet Take 1 tablet (25 mg total) by mouth daily. 30 tablet 0  . metFORMIN (GLUCOPHAGE) 850 MG tablet Take 850 mg by mouth 2 (two) times daily with a meal.    . metoprolol tartrate  (LOPRESSOR) 25 MG tablet Take 1 tablet (25 mg total) by mouth 2 (two) times daily. 60 tablet 1  . olmesartan (BENICAR) 40 MG tablet Take 40 mg by mouth daily.    Marland Kitchen omeprazole (PRILOSEC) 20 MG capsule Take 1 capsule (20 mg total) by mouth 2 (two) times daily. Take on an empty stomach 20 - 30 minutes prior to food. (Patient taking differently: Take 20 mg by mouth daily. Take on an empty stomach 20 - 30 minutes prior to food.) 180 capsule 3  . rosuvastatin (CRESTOR) 20 MG tablet Take 0.5 tablets (10 mg total) by mouth daily at 6 PM. 30 tablet 2  . sertraline (ZOLOFT) 100 MG tablet Take 100 mg by mouth daily.    . traMADol (ULTRAM) 50 MG tablet Take 1-2 tablets (50-100 mg total) by mouth every 6 (six) hours as needed for moderate pain. (Patient not taking: Reported on 09/11/2019) 28 tablet 0   No current facility-administered medications on file prior to visit.     Cardiovascular studies:  CABGX4 (LIMA-LAD< RIMA-PDA, SVG-OM, SVG-Diag) Dr [name] 08/14/2019  Coronary angiography 08/12/2019: LM: Distal LM calcific 80% stenosis LAD: Prox 40% stenosis LCx: Ostial 40% stenosis RCA: Moderately calcified vessel        Ostial calcific 60% stenosis        Distal RCA focal 40% stenosis        Ostial PDA 50% stenosis LVEDP normal  EKG 09/11/2019: Sinus  Rhythm 80 bpm.  Normal EKG.  Lexiscan myoview stress test 01/06/2019:  1. Lexiscan stress test with low level exercise was performed. Patient reached heart rate of 112 bpm which corresponds with 79% of the maximum predicted heart rate  No stress symptoms reported. Hemodynamic response was normal. In additions, the stress electrocardiogram showed sinus tachycardia, normal stress conduction, no stress arrhythmias and normal stress repolarization.   2. The overall quality of the study is good. There is no evidence of abnormal lung activity. Stress and rest SPECT images demonstrate homogeneous tracer distribution throughout the myocardium. Gated SPECT imaging  reveals normal myocardial thickening and wall motion. The left ventricular ejection fraction was normal (51%).   3. Low risk study.   Abdominal Aortic Duplex  01/24/2019 The maximum aorta diameter is 1.86 cm (dist). No evidence of significant atherosclerotic plaque. Normal aortic and iliac artery veloxity. Normal IVC.  Echocardiogram 01/24/2019 :  Left ventricle cavity is normal in size. Moderate concentric hypertrophy of the left ventricle. Normal global wall motion. Normal diastolic filling pattern. Calculated EF 55%. Left atrial cavity is slightly dilated at 4.5 cm.  Structurally normal tricuspid valve with trace regurgitation. The aortic root is mildly dilated at 3.9 cm.   Recent labs: Labs 12/11/2018: Glucose 97. BUN/Cr 22/0.8. eGFR 69/83.  Chol 140, TG 126, HDL 61, LDL 54.  Review of Systems  Constitution: Negative for decreased appetite, malaise/fatigue, weight gain and weight loss.  HENT: Negative for congestion.   Eyes: Negative for visual disturbance.  Cardiovascular: Positive for chest pain. Negative for dyspnea on exertion, leg swelling, palpitations and syncope.  Respiratory: Negative for shortness of breath.   Endocrine: Negative for cold intolerance.  Hematologic/Lymphatic: Does not bruise/bleed easily.  Skin: Negative for itching and rash.  Musculoskeletal: Negative for myalgias.  Gastrointestinal: Negative for abdominal pain, nausea and vomiting.  Genitourinary: Negative for dysuria.  Neurological: Negative for dizziness and weakness.  Psychiatric/Behavioral: The patient is not nervous/anxious.   All other systems reviewed and are negative.      Objective:    Vitals:   09/11/19 1532  BP: 128/70  Pulse: 86  Temp: 97.7 F (36.5 C)  SpO2: 97%     Physical Exam  Constitutional: He is oriented to person, place, and time. He appears well-developed and well-nourished. No distress.  HENT:  Head: Normocephalic and atraumatic.  Eyes: Pupils are equal, round,  and reactive to light. Conjunctivae are normal.  Neck: No JVD present.  Cardiovascular: Normal rate, regular rhythm and intact distal pulses.  No murmur heard. Pulmonary/Chest: Effort normal and breath sounds normal. He has no wheezes. He has no rales.  Abdominal: Soft. Bowel sounds are normal. There is no rebound.  Musculoskeletal:        General: No edema.  Lymphadenopathy:    He has no cervical adenopathy.  Neurological: He is alert and oriented to person, place, and time. No cranial nerve deficit.  Skin: Skin is warm and dry.  Psychiatric: He has a normal mood and affect.  Nursing note and vitals reviewed.       Assessment & Recommendations:    78 y/o Caucasian male with hypertension, type 2 DM, former smoker, CSD s/p CABGX4 (LIMA-LAD< RIMA-PDA, SVG-OM, SVG-Diag) Dr [name] 08/14/2019.  CAD s/p CABG: No angina symptoms. Switch Aspirin 325 mg to 81 mg. Recommend Xarelto 2.5 mg bid (COMPASS trial) Continue rest of the medical therapy.   Essential hypertension Controlled.   Insomnia: I suspect depression may be playing a role. I recommend melatonin for insomnia and discuss depression evaluation and treatment with Dr. Osborne Casco.   [name], MD [facility] [place] Pager: [phone] Office: [phone] If no answer Cell [phone]

## 2019-09-12 ENCOUNTER — Telehealth (HOSPITAL_COMMUNITY): Payer: Self-pay

## 2019-09-12 NOTE — Telephone Encounter (Signed)
Attempted to call patient in regards to Cardiac Rehab - LM on VM 

## 2019-09-24 ENCOUNTER — Telehealth (HOSPITAL_COMMUNITY): Payer: Self-pay

## 2019-09-24 NOTE — Telephone Encounter (Signed)
No response from pt regarding CR.  Closed referral.  

## 2019-09-25 ENCOUNTER — Other Ambulatory Visit: Payer: Self-pay

## 2019-09-25 NOTE — Patient Outreach (Signed)
  [facility]) Care Management Chronic Special Needs Program  09/25/2019  Name: CRISTINA HENSEY DOB: 06/20/1941  MRN: [id number]  Mr. Errick Brodt is enrolled in a chronic special needs plan for Diabetes. Client called with no answer No answer and HIPAA compliant message left. 1st attempt Plan for 2nd outreach call in one week Chronic care management coordinator will attempt outreach in one week.   [name] RN, Jack[facility]ul[facility] [place], CDE Chronic Care Management Coordinator [place] [facility] Management [phone]

## 2019-09-30 ENCOUNTER — Other Ambulatory Visit: Payer: Self-pay

## 2019-09-30 NOTE — Patient Outreach (Signed)
  [name]) Care Management Chronic Special Needs Program  09/30/2019  Name: [name] DOB: 02/16/1941  MRN: [id number]  [name] is enrolled in a chronic special needs plan for Diabetes. Reviewed and updated care plan.  Subjective: Client states that he is feeling better now that he is recovering from his heart surgery.  Denies any chest pains or shortness of breath.  States he is walking around a mile about every other day and he is active in his yard.  States that his blood sugars have been good and they range from 80-130 most days.States he is trying to eat healthy and he has lost 10 lbs since his surgery  Goals Addressed            This Visit's Progress   . Client understands the importance of follow-up with providers by attending scheduled visits   On track   . Client will use Assistive Devices as needed and verbalize understanding of device use   On track   . Client will verbalize knowledge of self management of Hypertension as evidences by BP reading of 140/90 or less; or as defined by provider   On track   . COMPLETED: General - Client will not be readmitted within 30 days (C-SNP)discharged 08/19/19       Completed no readmission post 30 days of discharge    . HEMOGLOBIN A1C < 7.0        Diabetes self management actions:  Glucose monitoring per provider recommendations  Eat Healthy  Check feet daily  Visit provider every 3-6 months as directed  Hbg A1C level every 3-6 months.  Eye Exam yearly    . Maintain timely refills of diabetic medication as prescribed within the year .   On track   . COMPLETED: Obtain Annual Eye (retinal)  Exam        Completed 06/13/19    . Obtain Annual Foot Exam   On track   . Obtain annual screen for micro albuminuria (urine) , nephropathy (kidney problems)   On track   . COMPLETED: Obtain Hemoglobin A1C at least 2 times per year       Checked 05/30/19, 08/13/19    . COMPLETED: Visit Primary Care  Provider or Endocrinologist at least 2 times per year        Visits 01/15/19,04/18/19,06/06/19     Client is  meeting diabetes self management goal of hemoglobin A1C of <7% with last reading of 6.4% Reinforced to follow a heart healthy low CHO, low sodium diet Reinforced to keep follow up appts with his primary care doctor and  cardiology  Encouraged to continue to walk regularly and to be active Reviewed number for 24-hour nurse Line Reviewed COVID 19 precautions  Plan:  Send successful outreach letter with a copy of their individualized care plan and Send individual care plan to provider  Chronic care management coordinator will outreach in:  4-6 Months     [name] RN, [facility], [place] Management Coordinator [place] Management [phone]

## 2019-10-06 DIAGNOSIS — M79642 Pain in left hand: Secondary | ICD-10-CM | POA: Diagnosis not present

## 2019-10-06 DIAGNOSIS — M180 Bilateral primary osteoarthritis of first carpometacarpal joints: Secondary | ICD-10-CM | POA: Diagnosis not present

## 2019-10-06 DIAGNOSIS — M79641 Pain in right hand: Secondary | ICD-10-CM | POA: Diagnosis not present

## 2019-10-27 DIAGNOSIS — H353122 Nonexudative age-related macular degeneration, left eye, intermediate dry stage: Secondary | ICD-10-CM | POA: Diagnosis not present

## 2019-10-27 DIAGNOSIS — H353114 Nonexudative age-related macular degeneration, right eye, advanced atrophic with subfoveal involvement: Secondary | ICD-10-CM | POA: Diagnosis not present

## 2019-10-27 DIAGNOSIS — H35033 Hypertensive retinopathy, bilateral: Secondary | ICD-10-CM | POA: Diagnosis not present

## 2019-10-27 DIAGNOSIS — H35433 Paving stone degeneration of retina, bilateral: Secondary | ICD-10-CM | POA: Diagnosis not present

## 2019-11-03 ENCOUNTER — Ambulatory Visit: Payer: HMO

## 2019-11-25 DIAGNOSIS — I129 Hypertensive chronic kidney disease with stage 1 through stage 4 chronic kidney disease, or unspecified chronic kidney disease: Secondary | ICD-10-CM | POA: Diagnosis not present

## 2019-11-25 DIAGNOSIS — E1151 Type 2 diabetes mellitus with diabetic peripheral angiopathy without gangrene: Secondary | ICD-10-CM | POA: Diagnosis not present

## 2019-11-25 DIAGNOSIS — R110 Nausea: Secondary | ICD-10-CM | POA: Diagnosis not present

## 2019-11-25 DIAGNOSIS — N182 Chronic kidney disease, stage 2 (mild): Secondary | ICD-10-CM | POA: Diagnosis not present

## 2019-11-25 DIAGNOSIS — M545 Low back pain: Secondary | ICD-10-CM | POA: Diagnosis not present

## 2019-11-27 ENCOUNTER — Ambulatory Visit: Payer: HMO | Attending: Internal Medicine

## 2019-11-27 DIAGNOSIS — Z20822 Contact with and (suspected) exposure to covid-19: Secondary | ICD-10-CM

## 2019-11-29 LAB — NOVEL CORONAVIRUS, NAA: SARS-CoV-2, NAA: DETECTED — AB

## 2019-11-30 ENCOUNTER — Ambulatory Visit: Payer: Self-pay

## 2019-11-30 NOTE — Telephone Encounter (Signed)
This encounter was created in error - please disregard.

## 2019-11-30 NOTE — Telephone Encounter (Signed)
Pt given Covid-19 positive results. Discussed mild, moderate and severe symptoms. Advised pt to call 911 for any respiratory issues and/dehydration. Discussed non test criteria for ending self isolation. Pt advised of way to manage symptoms at home and review isolation precautions especially the importance of washing hands frequently and wearing a mask when around others. Pt verbalized understanding. Will report to HD.  Reason for Disposition . Health Information question, no triage required and triager able to answer question  Answer Assessment - Initial Assessment Questions 1. REASON FOR CALL or QUESTION: "What is your reason for calling today?" or "How can I best help you?" or "What question do you have that I can help answer?"     Results  Protocols used: INFORMATION ONLY CALL-A-AH

## 2019-11-30 NOTE — Telephone Encounter (Signed)
Pt called back stating no one has told him his covid results. See previous note.  Pt given Covid-19 positive results. Discussed mild, moderate and severe symptoms. Advised pt to call 911 for any respiratory issues and/dehydration. Discussed non test criteria for ending self isolation. Pt advised of way to manage symptoms at home and review isolation precautions especially the importance of washing hands frequently and wearing a mask when around others. Pt verbalized understanding.

## 2019-12-10 DIAGNOSIS — U071 COVID-19: Secondary | ICD-10-CM | POA: Diagnosis not present

## 2019-12-10 DIAGNOSIS — I129 Hypertensive chronic kidney disease with stage 1 through stage 4 chronic kidney disease, or unspecified chronic kidney disease: Secondary | ICD-10-CM | POA: Diagnosis not present

## 2019-12-10 DIAGNOSIS — M1990 Unspecified osteoarthritis, unspecified site: Secondary | ICD-10-CM | POA: Diagnosis not present

## 2019-12-10 DIAGNOSIS — K760 Fatty (change of) liver, not elsewhere classified: Secondary | ICD-10-CM | POA: Diagnosis not present

## 2019-12-10 DIAGNOSIS — Z1331 Encounter for screening for depression: Secondary | ICD-10-CM | POA: Diagnosis not present

## 2019-12-10 DIAGNOSIS — I679 Cerebrovascular disease, unspecified: Secondary | ICD-10-CM | POA: Diagnosis not present

## 2019-12-10 DIAGNOSIS — E7800 Pure hypercholesterolemia, unspecified: Secondary | ICD-10-CM | POA: Diagnosis not present

## 2019-12-10 DIAGNOSIS — D692 Other nonthrombocytopenic purpura: Secondary | ICD-10-CM | POA: Diagnosis not present

## 2019-12-10 DIAGNOSIS — E1151 Type 2 diabetes mellitus with diabetic peripheral angiopathy without gangrene: Secondary | ICD-10-CM | POA: Diagnosis not present

## 2019-12-10 DIAGNOSIS — I25810 Atherosclerosis of coronary artery bypass graft(s) without angina pectoris: Secondary | ICD-10-CM | POA: Diagnosis not present

## 2019-12-10 DIAGNOSIS — F331 Major depressive disorder, recurrent, moderate: Secondary | ICD-10-CM | POA: Diagnosis not present

## 2019-12-10 DIAGNOSIS — I209 Angina pectoris, unspecified: Secondary | ICD-10-CM | POA: Diagnosis not present

## 2019-12-10 DIAGNOSIS — N182 Chronic kidney disease, stage 2 (mild): Secondary | ICD-10-CM | POA: Diagnosis not present

## 2019-12-22 DIAGNOSIS — H5201 Hypermetropia, right eye: Secondary | ICD-10-CM | POA: Diagnosis not present

## 2019-12-22 DIAGNOSIS — H524 Presbyopia: Secondary | ICD-10-CM | POA: Diagnosis not present

## 2019-12-22 DIAGNOSIS — Z7984 Long term (current) use of oral hypoglycemic drugs: Secondary | ICD-10-CM | POA: Diagnosis not present

## 2019-12-22 DIAGNOSIS — E119 Type 2 diabetes mellitus without complications: Secondary | ICD-10-CM | POA: Diagnosis not present

## 2019-12-22 DIAGNOSIS — E11319 Type 2 diabetes mellitus with unspecified diabetic retinopathy without macular edema: Secondary | ICD-10-CM | POA: Diagnosis not present

## 2019-12-22 DIAGNOSIS — H52223 Regular astigmatism, bilateral: Secondary | ICD-10-CM | POA: Diagnosis not present

## 2020-01-05 ENCOUNTER — Other Ambulatory Visit: Payer: Self-pay

## 2020-01-05 NOTE — Patient Outreach (Signed)
  [facility]) Care Management Chronic Special Needs Program  01/05/2020  Name: Timothy [name] DOB: 1941-03-25  MRN: [id number]  Mr. Axcel Bhagat is enrolled in a chronic special needs plan for Diabetes. Client called with no answer No answer and HIPAA compliant message left. 1st attempt Plan for 2nd outreach call in one week Chronic care management coordinator will attempt outreach in one week.   [name] RN, Jackquline Denmark, CDE Chronic Care Management Coordinator [place] [facility] [phone]

## 2020-01-06 DIAGNOSIS — H35033 Hypertensive retinopathy, bilateral: Secondary | ICD-10-CM | POA: Diagnosis not present

## 2020-01-06 DIAGNOSIS — H353122 Nonexudative age-related macular degeneration, left eye, intermediate dry stage: Secondary | ICD-10-CM | POA: Diagnosis not present

## 2020-01-06 DIAGNOSIS — H35433 Paving stone degeneration of retina, bilateral: Secondary | ICD-10-CM | POA: Diagnosis not present

## 2020-01-06 DIAGNOSIS — H353114 Nonexudative age-related macular degeneration, right eye, advanced atrophic with subfoveal involvement: Secondary | ICD-10-CM | POA: Diagnosis not present

## 2020-01-12 ENCOUNTER — Other Ambulatory Visit: Payer: Self-pay

## 2020-01-12 NOTE — Patient Outreach (Signed)
  [facility]) Care Management Chronic Special Needs Program  01/12/2020  Name: Timothy [name] DOB: 02/01/1941  MRN: [id number]  [name] is enrolled in a chronic special needs plan for Diabetes. Client called with no answer No answer and HIPAA compliant message left. 2nd attempt Plan for 3rd outreach call in one week Chronic care management coordinator will attempt outreach in one week.   [name] RN, Jackquline Denmark, CDE Chronic Care Management Coordinator [place] [facility] [phone]

## 2020-01-13 ENCOUNTER — Ambulatory Visit: Payer: Self-pay

## 2020-01-20 ENCOUNTER — Other Ambulatory Visit: Payer: Self-pay

## 2020-01-20 NOTE — Patient Outreach (Signed)
[facility]) Care Management Chronic Special Needs Program  01/20/2020  Name: Timothy [name] DOB: September 27, 1941  MRN: [id number]  [name] is enrolled in a chronic special needs plan for Diabetes. Client called with no answer and HIPAA compliant message left 3rd attempt Client has not responded to three outreach attempts.  The client's individualized care plan was developed based upon the completed health risk assessment. Goals Addressed            This Visit's Progress   . Client understands the importance of follow-up with providers by attending scheduled visits   On track    Plan to keep scheduled appointments with providers    . Client will report no worsening of symptoms related to heart disease within the next 12 months       Sent EMMI: Heart Disease in Diabetics    . Client will use Assistive Devices as needed and verbalize understanding of device use   On track    [employer] approved meters are-One Touch, Freestyle or Precision     . Client will verbalize knowledge of self management of Hypertension as evidences by BP reading of 140/90 or less; or as defined by provider   On track    Plan to check B/P regularly Take B/P medications as ordered Plan to follow a low salt diet  Increase activity as tolerated EMMI: High Blood Pressure(Hypertension) What you can do    . HEMOGLOBIN A1C < 7.0       Last Hemoglobin A1C 5.9% on 11/25/19 Your fasting blood sugar goals of 80-130 and less than 180 1 1/2-2 hours after meals Plan to follow a low carbohydrate low salt diet and to watch portion sizes    . Maintain timely refills of diabetic medication as prescribed within the year .   On track    It is important to get your medications refilled on time    . COMPLETED: Obtain annual  Lipid Profile, LDL-C       Last completed 05/30/19 LDL 105 The goal for LDL is less than 70 mg/dL as you are at high risk for complications Try to avoid saturated  fats, trans-fats and eat more fiber Plan to take statin as ordered    . Obtain Annual Eye (retinal)  Exam    On track    Last completed 06/13/19 Plan to have a dilated eye exam every year    . Obtain Annual Foot Exam   On track    Check your skin and feet every day for cuts, bruises, redness, blisters, or sores. Schedule a foot exam with your health care provider once every year    . Obtain annual screen for micro albuminuria (urine) , nephropathy (kidney problems)   On track    It is important for your doctor to check your urine for protein at least every year     . Obtain Hemoglobin A1C at least 2 times per year   On track    Last completed 11/25/19 It is important to have your Hemoglobin A1C checked every 6 months if you are at goal and every 3 months if you are not at goal    . Visit Primary Care Provider or Endocrinologist at least 2 times per year    On track    Primary care provider 11/25/19 Please schedule your annual wellness visit     Client is  meeting diabetes self management goal of hemoglobin A1C of <7% with last reading  of 5.9% Plan:   . Send unsuccessful outreach letter with a copy of individualized care plan to client . Send individualized care plan to provider . Educational Materials: EMMI-High Blood Pressure What you can do, Heart Disease in Diabetics  Chronic care management coordinator will attempt outreach in 3-6 Months.   [name] RN, Jackquline Denmark, CDE Chronic Care Management Coordinator [place] [facility] [phone]

## 2020-03-02 ENCOUNTER — Other Ambulatory Visit: Payer: Self-pay | Admitting: Cardiology

## 2020-03-02 DIAGNOSIS — I25810 Atherosclerosis of coronary artery bypass graft(s) without angina pectoris: Secondary | ICD-10-CM

## 2020-03-04 NOTE — Progress Notes (Signed)
Patient is here for follow up visit.  Subjective:   Timothy [name], male    DOB: 11-Feb-1941, 79 y.o.   MRN: [id number]   Chief Complaint  Patient presents with  . Coronary Artery Disease    6 month follow up    79 y/o Caucasian male with hypertension, type 2 DM, former smoker, CSD s/p CABGX4 (LIMA-LAD, RIMA-PDA, SVG-OM, SVG-Diag) Dr [name] 08/14/2019.  Patient had [place] in 11/2019.He has been doing well and denies chest pain, shortness of breath, palpitations, leg edema, orthopnea, PND, TIA/syncope. However, his blood pressure is elevated. He does not have his medications or medications list with him today.    Current Outpatient Medications on File Prior to Visit  Medication Sig Dispense Refill  . amLODipine (NORVASC) 10 MG tablet Take 10 mg by mouth at bedtime.    . ferrous [id number] C-folic acid (TRINSICON / FOLTRIN) capsule Take 1 capsule by mouth 2 (two) times daily after a meal. 60 capsule 1  . hydrochlorothiazide (HYDRODIURIL) 25 MG tablet Take 1 tablet (25 mg total) by mouth daily. 30 tablet 0  . metFORMIN (GLUCOPHAGE) 850 MG tablet Take 0.5 mg by mouth daily.     Marland Kitchen olmesartan (BENICAR) 40 MG tablet Take 40 mg by mouth daily.    Marland Kitchen omeprazole (PRILOSEC) 20 MG capsule Take 1 capsule (20 mg total) by mouth 2 (two) times daily. Take on an empty stomach 20 - 30 minutes prior to food. (Patient taking differently: Take 20 mg by mouth daily. Take on an empty stomach 20 - 30 minutes prior to food.) 180 capsule 3  . rosuvastatin (CRESTOR) 20 MG tablet Take 0.5 tablets (10 mg total) by mouth daily at 6 PM. 30 tablet 2  . sertraline (ZOLOFT) 100 MG tablet Take 100 mg by mouth daily.    Alveda Reasons 2.5 MG TABS tablet Take 1 tablet by mouth twice daily 60 tablet 0  . aspirin EC 81 MG tablet Take 1 tablet (81 mg total) by mouth daily. (Patient not taking: Reported on 03/05/2020) 90 tablet 3  . metoprolol tartrate (LOPRESSOR) 25 MG tablet Take 1 tablet (25 mg total) by mouth 2  (two) times daily. 60 tablet 1   No current facility-administered medications on file prior to visit.    Cardiovascular studies:  EKG 03/05/2020: Sinus rhythm 71 bpm. Normal EKG.  CABGX4 (LIMA-LAD< RIMA-PDA, SVG-OM, SVG-Diag) Dr [name] 08/14/2019  Coronary angiography 08/12/2019: LM: Distal LM calcific 80% stenosis LAD: Prox 40% stenosis LCx: Ostial 40% stenosis RCA: Moderately calcified vessel        Ostial calcific 60% stenosis        Distal RCA focal 40% stenosis        Ostial PDA 50% stenosis LVEDP normal  EKG 09/11/2019: Sinus  Rhythm 80 bpm.  Normal EKG.  Lexiscan myoview stress test 01/06/2019:  1. Lexiscan stress test with low level exercise was performed. Patient reached heart rate of 112 bpm which corresponds with 79% of the maximum predicted heart rate  No stress symptoms reported. Hemodynamic response was normal. In additions, the stress electrocardiogram showed sinus tachycardia, normal stress conduction, no stress arrhythmias and normal stress repolarization.   2. The overall quality of the study is good. There is no evidence of abnormal lung activity. Stress and rest SPECT images demonstrate homogeneous tracer distribution throughout the myocardium. Gated SPECT imaging reveals normal myocardial thickening and wall motion. The left ventricular ejection fraction was normal (51%).   3. Low risk study.   Abdominal  Aortic Duplex  01/24/2019 The maximum aorta diameter is 1.86 cm (dist). No evidence of significant atherosclerotic plaque. Normal aortic and iliac artery veloxity. Normal IVC.  Echocardiogram 01/24/2019 :  Left ventricle cavity is normal in size. Moderate concentric hypertrophy of the left ventricle. Normal global wall motion. Normal diastolic filling pattern. Calculated EF 55%. Left atrial cavity is slightly dilated at 4.5 cm. Structurally normal tricuspid valve with trace regurgitation. The aortic root is mildly dilated at 3.9 cm.   Recent  labs: 08/18/2019: Glucose 123, BUN/Cr 15/0.99. EGFR >60. Na/K 138/3.8.  H/H 8.5/26. MCV 87. Platelets 123 HbA1C 6.4%  05/2019: Chol 182, TG 186, HDL 40, LDL 105  Labs 12/11/2018: Glucose 97. BUN/Cr 22/0.8. eGFR 69/83.  Chol 140, TG 126, HDL 61, LDL 54.  Review of Systems  Cardiovascular: Negative for chest pain, dyspnea on exertion, leg swelling, palpitations and syncope.       Objective:    Vitals:   03/05/20 1125 03/05/20 1135  BP: (!) 162/82 (!) 180/92  Pulse: 74 75  Temp: 98.1 F (36.7 C)   SpO2: 97%      Physical Exam  Constitutional: He appears well-developed and well-nourished.  Neck: No JVD present.  Cardiovascular: Normal rate, regular rhythm, normal heart sounds and intact distal pulses.  No murmur heard. Pulmonary/Chest: Effort normal and breath sounds normal. He has no wheezes. He has no rales.  Musculoskeletal:        General: No edema.  Nursing note and vitals reviewed.       Assessment & Recommendations:    79 y/o Caucasian male with hypertension, type 2 DM, former smoker, CSD s/p CABGX4 (LIMA-LAD< RIMA-PDA, SVG-OM, SVG-Diag) Dr [name] 08/14/2019.  CAD s/p CABG: No angina symptoms. Switch Aspirin 325 mg to 81 mg. Recommend Xarelto 2.5 mg bid (COMPASS trial) Continue rest of the medical therapy.   Essential hypertension Uncontrolled. Unsure what medications he is taking.  Recommend f/u visit in 4 weeks with list of his medications and the medications bottles. No change made today.  Insomnia: Resolved.  [name], MD [facility] [place] Pager: [phone] Office: [phone] If no answer Cell [phone]

## 2020-03-05 ENCOUNTER — Ambulatory Visit: Payer: HMO | Admitting: Cardiology

## 2020-03-05 ENCOUNTER — Other Ambulatory Visit: Payer: Self-pay

## 2020-03-05 ENCOUNTER — Encounter: Payer: Self-pay | Admitting: Cardiology

## 2020-03-05 VITALS — BP 161/82 | HR 72 | Temp 98.1°F | Ht 66.0 in | Wt 153.0 lb

## 2020-03-05 DIAGNOSIS — E119 Type 2 diabetes mellitus without complications: Secondary | ICD-10-CM

## 2020-03-05 DIAGNOSIS — I10 Essential (primary) hypertension: Secondary | ICD-10-CM

## 2020-03-05 DIAGNOSIS — I25810 Atherosclerosis of coronary artery bypass graft(s) without angina pectoris: Secondary | ICD-10-CM

## 2020-03-11 ENCOUNTER — Ambulatory Visit: Payer: HMO | Admitting: Cardiology

## 2020-04-04 ENCOUNTER — Other Ambulatory Visit: Payer: Self-pay | Admitting: Cardiology

## 2020-04-04 DIAGNOSIS — I25810 Atherosclerosis of coronary artery bypass graft(s) without angina pectoris: Secondary | ICD-10-CM

## 2020-04-16 ENCOUNTER — Ambulatory Visit: Payer: HMO | Admitting: Cardiology

## 2020-04-22 ENCOUNTER — Other Ambulatory Visit: Payer: Self-pay

## 2020-04-22 NOTE — Patient Outreach (Signed)
°  [place] [facility]) Care Management Chronic Special Needs Program  04/22/2020  Name: Timothy [name] DOB: 1941/11/07  MRN: [id number]  [name] is enrolled in a chronic special needs plan for Diabetes. Client called with no answer No answer and HIPAA compliant message left. 1st attempt Plan for 2d outreach call in 1-2 weeks Chronic care management coordinator will attempt outreach in 1-2 weeks.   [name] RN, Jackquline Denmark, CDE Chronic Care Management Coordinator [place] [facility] [phone]

## 2020-05-06 ENCOUNTER — Other Ambulatory Visit: Payer: Self-pay

## 2020-05-06 NOTE — Patient Outreach (Signed)
  [place] [facility]) Care Management Chronic Special Needs Program  05/06/2020  Name: Timothy [name] DOB: 1941-01-03  MRN: [id number]  Mr. Thompson Mckim is enrolled in a chronic special needs plan for Diabetes. Client called with no answer No answer and HIPAA compliant message left. 2nd attempt Plan for 3rd outreach call in 1-2  weeks Chronic care management coordinator will attempt outreach in 1-2 weeks.   [name] RN, Jackquline Denmark, CDE Chronic Care Management Coordinator [place] [facility] [phone]

## 2020-05-12 ENCOUNTER — Other Ambulatory Visit: Payer: Self-pay

## 2020-05-12 NOTE — Patient Outreach (Addendum)
[place] [facility]) Care Management Chronic Special Needs Program  05/12/2020  Name: Timothy [name] DOB: Oct 17, 1941  MRN: [id number]  Mr. Khaleem Burchill is enrolled in a chronic special needs plan for  Diabetes.  Client called with no answer and HIPAA compliant message left.  3rd attempt   Client returned call HIPAA verified   Subjective:  Client states that he has been doing good and staying busy.  States he does not exercise regularly but works on cars or around his home.  States he did go to the gym before things shut down and he would like to go back.  States he is checking his blood sugars 2-3 times a week and they range from 100-160.  Denies any low readings.  States he is trying to eat healthy and is working on eating more vegetables.  Denies any chest pains or shortness of breath.  States his B/P is usually around 130-140/80-90 when he checks it at home.  States he has his physical scheduled in July.  Denies any issues affording his medications.    Goals Addressed              This Visit's Progress     COMPLETED: Client understands the importance of follow-up with providers by attending scheduled visits   On track     Keep scheduled appointments with providers Goal completed 05/12/20      Client will report no worsening of symptoms related to heart disease within the next 12 months   On track     No ED or hospital visits for heart issues Notify provider for symptoms of chest pain, sweating, nausea/vomiting, irregular heartbeat, palpitations, rapid heart rate, shortness of breath or dizziness or fainting. Call 911 for severe symptoms of chest pain or shortness of breath. Take medications as prescribed      COMPLETED: Client will use Assistive Devices as needed and verbalize understanding of device use   On track     [place] approved meter One Touch per records Goal completed 05/12/20      Client will verbalize knowledge of self  management of Hypertension as evidences by BP reading of 140/90 or less; or as defined by provider   On track     Plan to check B/P regularly Take B/P medications as ordered Plan to follow a low salt diet  Increase activity as tolerated       Diabetes Patient stated goal to go to gym 2-3 times a week (pt-stated)        Instructed on importance of getting regular exercise to help lower blood sugar and control his B/P      HEMOGLOBIN A1C < 7        Last Hemoglobin A1C 5.9% on 11/25/19 Plan to check blood sugars as directed with goals fasting or 1 1/2 hours after eating with goal of 80-130 fasting and 180 or less after meals Plan to follow a low carbohydrate, low salt diet, watch portion sizes and avoid sugar sweetened drinks Please review diabetes action plan in [employer] calendar      COMPLETED: Maintain timely refills of diabetic medication as prescribed within the year .   On track     Maintaining timely refills of medications per dispense report It is important to get your medications refilled on time Goal completed 05/12/20      Obtain annual  Lipid Profile, LDL-C   On track     Last completed 05/30/19 LDL 105  The goal for LDL is less than 70 mg/dL as you are at high risk for complications Try to avoid saturated fats, trans-fats and eat more fiber Plan to take statin as ordered Sent EMMI: Low cholesterol, saturated fat and trans fat diet      COMPLETED: Obtain Annual Eye (retinal)  Exam    On track     Last completed 12/22/19 Plan to have a dilated eye exam every year Goal completed 05/12/20      Obtain Annual Foot Exam   On track     Check your skin and feet every day for cuts, bruises, redness, blisters, or sores. Schedule a foot exam with your health care provider once every year      Obtain annual screen for micro albuminuria (urine) , nephropathy (kidney problems)   On track     It is important for your doctor to check your urine for protein at least every  year       Obtain Hemoglobin A1C at least 2 times per year   On track     Last completed 11/25/19 It is important to have your Hemoglobin A1C checked every 6 months if you are at goal and every 3 months if you are not at goal      Visit Primary Care Provider or Endocrinologist at least 2 times per year    On track     Primary care provider 11/25/19 Last annual wellness visit 06/06/19 Please schedule your annual wellness visit        Plan:   Send successful outreach letter with a copy of individualized care plan to client  Send individualized care plan to provider Educational Materials-EMMI: Low cholesterol, saturated fat and trans fat diet   Chronic care management coordinator will  outreach in 12 months per tier level or sooner as needed.  [name] RN, Jackquline Denmark, CDE Chronic Care Management Coordinator [place] [facility] [phone]

## 2020-05-13 ENCOUNTER — Ambulatory Visit: Payer: Self-pay

## 2020-06-10 DIAGNOSIS — Z125 Encounter for screening for malignant neoplasm of prostate: Secondary | ICD-10-CM | POA: Diagnosis not present

## 2020-06-10 DIAGNOSIS — E7800 Pure hypercholesterolemia, unspecified: Secondary | ICD-10-CM | POA: Diagnosis not present

## 2020-06-10 DIAGNOSIS — E1151 Type 2 diabetes mellitus with diabetic peripheral angiopathy without gangrene: Secondary | ICD-10-CM | POA: Diagnosis not present

## 2020-06-17 DIAGNOSIS — E1151 Type 2 diabetes mellitus with diabetic peripheral angiopathy without gangrene: Secondary | ICD-10-CM | POA: Diagnosis not present

## 2020-06-17 DIAGNOSIS — I679 Cerebrovascular disease, unspecified: Secondary | ICD-10-CM | POA: Diagnosis not present

## 2020-06-17 DIAGNOSIS — M1990 Unspecified osteoarthritis, unspecified site: Secondary | ICD-10-CM | POA: Diagnosis not present

## 2020-06-17 DIAGNOSIS — Z1389 Encounter for screening for other disorder: Secondary | ICD-10-CM | POA: Diagnosis not present

## 2020-06-17 DIAGNOSIS — D692 Other nonthrombocytopenic purpura: Secondary | ICD-10-CM | POA: Diagnosis not present

## 2020-06-17 DIAGNOSIS — I129 Hypertensive chronic kidney disease with stage 1 through stage 4 chronic kidney disease, or unspecified chronic kidney disease: Secondary | ICD-10-CM | POA: Diagnosis not present

## 2020-06-17 DIAGNOSIS — D509 Iron deficiency anemia, unspecified: Secondary | ICD-10-CM | POA: Diagnosis not present

## 2020-06-17 DIAGNOSIS — I209 Angina pectoris, unspecified: Secondary | ICD-10-CM | POA: Diagnosis not present

## 2020-06-17 DIAGNOSIS — N182 Chronic kidney disease, stage 2 (mild): Secondary | ICD-10-CM | POA: Diagnosis not present

## 2020-06-17 DIAGNOSIS — I25810 Atherosclerosis of coronary artery bypass graft(s) without angina pectoris: Secondary | ICD-10-CM | POA: Diagnosis not present

## 2020-06-17 DIAGNOSIS — Z8616 Personal history of COVID-19: Secondary | ICD-10-CM | POA: Diagnosis not present

## 2020-06-17 DIAGNOSIS — Z0000 Encounter for general adult medical examination without abnormal findings: Secondary | ICD-10-CM | POA: Diagnosis not present

## 2020-06-17 DIAGNOSIS — R82998 Other abnormal findings in urine: Secondary | ICD-10-CM | POA: Diagnosis not present

## 2020-06-24 DIAGNOSIS — Z1212 Encounter for screening for malignant neoplasm of rectum: Secondary | ICD-10-CM | POA: Diagnosis not present

## 2020-06-30 DIAGNOSIS — H5201 Hypermetropia, right eye: Secondary | ICD-10-CM | POA: Diagnosis not present

## 2020-06-30 DIAGNOSIS — H52223 Regular astigmatism, bilateral: Secondary | ICD-10-CM | POA: Diagnosis not present

## 2020-06-30 DIAGNOSIS — H53143 Visual discomfort, bilateral: Secondary | ICD-10-CM | POA: Diagnosis not present

## 2020-06-30 DIAGNOSIS — H524 Presbyopia: Secondary | ICD-10-CM | POA: Diagnosis not present

## 2020-07-06 DIAGNOSIS — H353122 Nonexudative age-related macular degeneration, left eye, intermediate dry stage: Secondary | ICD-10-CM | POA: Diagnosis not present

## 2020-07-06 DIAGNOSIS — H43813 Vitreous degeneration, bilateral: Secondary | ICD-10-CM | POA: Diagnosis not present

## 2020-07-06 DIAGNOSIS — H35033 Hypertensive retinopathy, bilateral: Secondary | ICD-10-CM | POA: Diagnosis not present

## 2020-07-06 DIAGNOSIS — H353114 Nonexudative age-related macular degeneration, right eye, advanced atrophic with subfoveal involvement: Secondary | ICD-10-CM | POA: Diagnosis not present

## 2020-07-15 ENCOUNTER — Other Ambulatory Visit: Payer: Self-pay

## 2020-07-15 NOTE — Patient Outreach (Signed)
  [place] [facility]) Care Management Chronic Special Needs Program    07/15/2020  Name: Timothy [name], Timothy [name]: May 23, 1941  MRN: [id number]   [name] is enrolled in a chronic special needs plan for Diabetes. Received referral 07/14/20 from [employer] utilization management that client is now in coverage gap and needs assistance with the cost of his Xarelto  Referral placed to pharmacy for pharmacy assistance Client will be followed up per tier level or sooner if needed.  [name] RN, Jackquline Denmark, CDE Chronic Care Management Coordinator [place] [facility] [phone]

## 2020-08-18 IMAGING — CR DG CHEST 2V
2 series · 2 of 2 positions shown · non-contrast
Comparison: Radiographs August 13, 2019.

CLINICAL DATA: Preop cardiovascular exam.

EXAM:
CHEST - 2 VIEW

[chest lat]
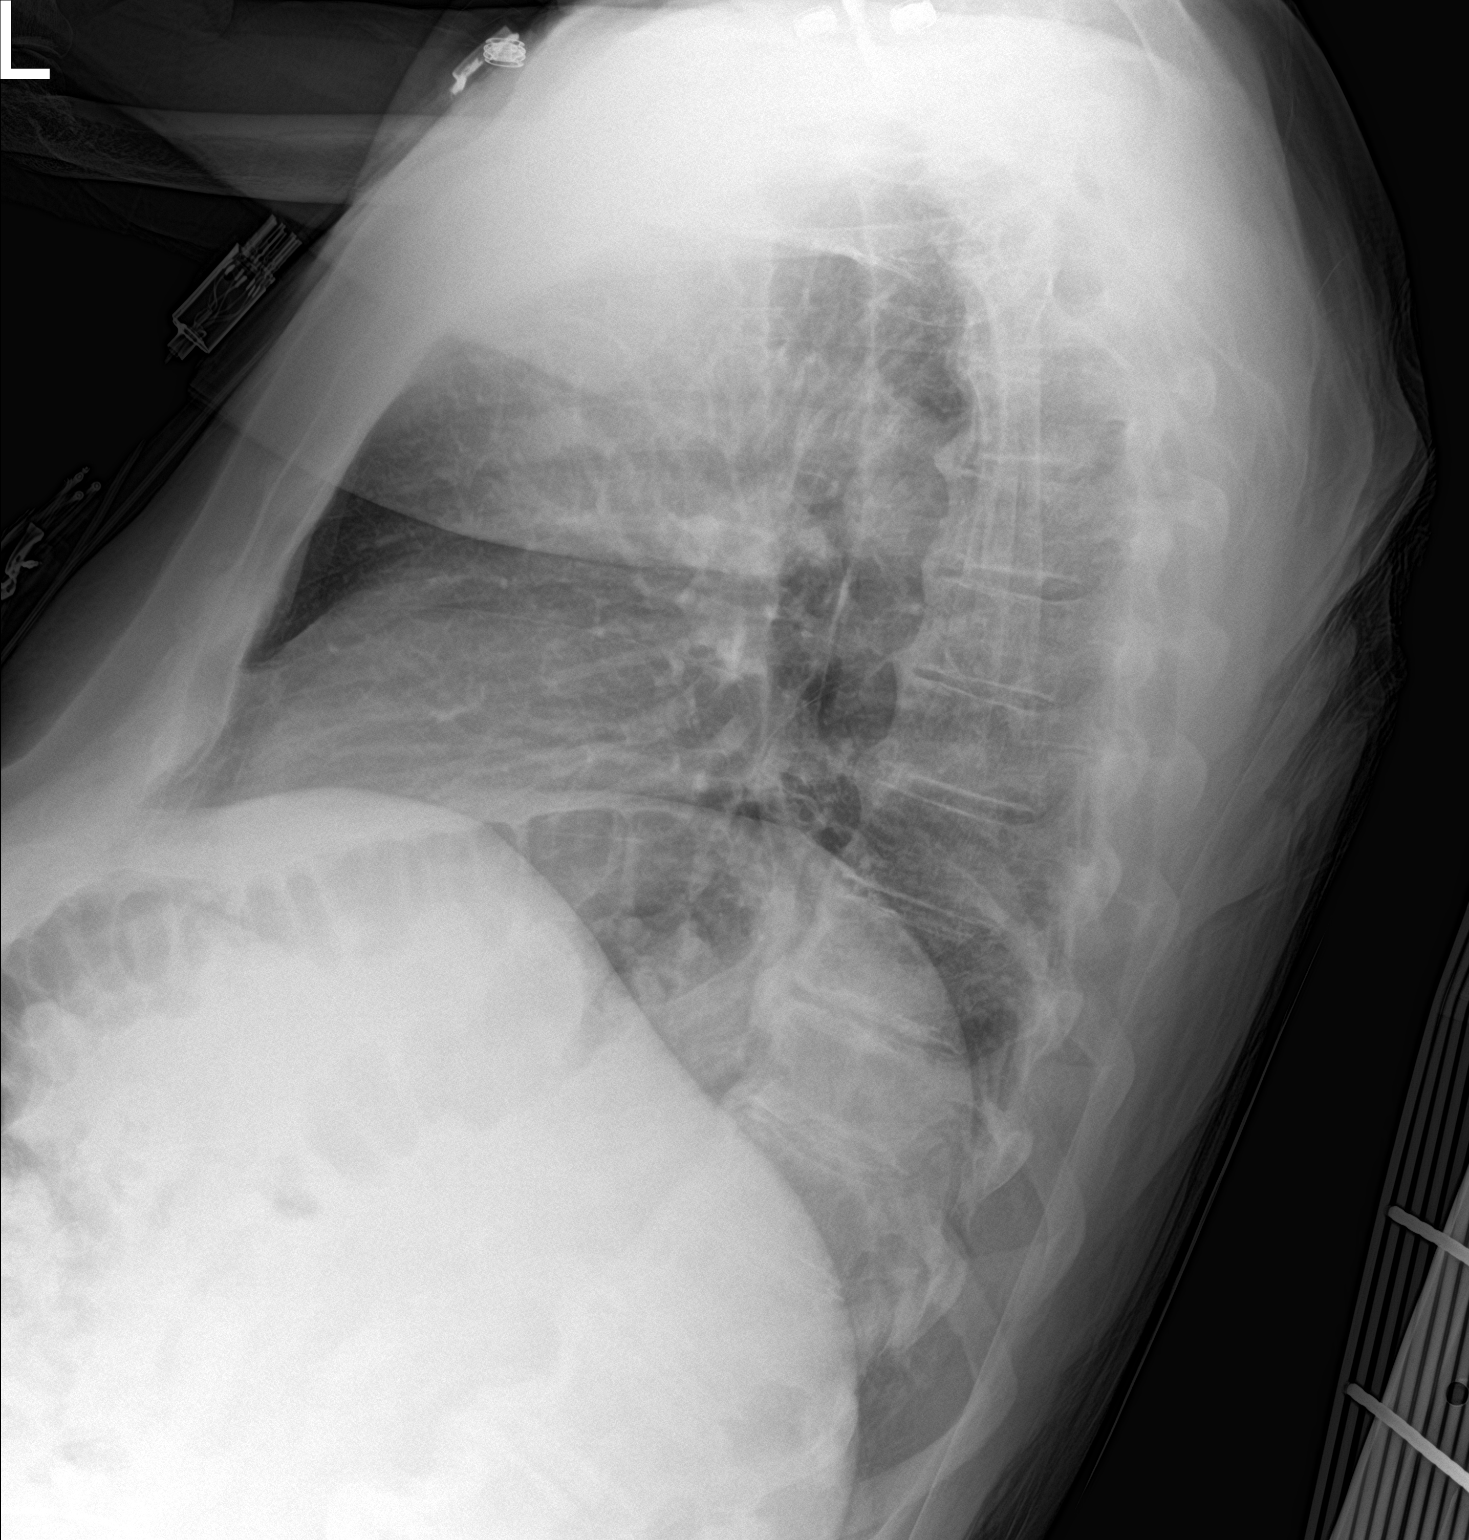

[chest ap]
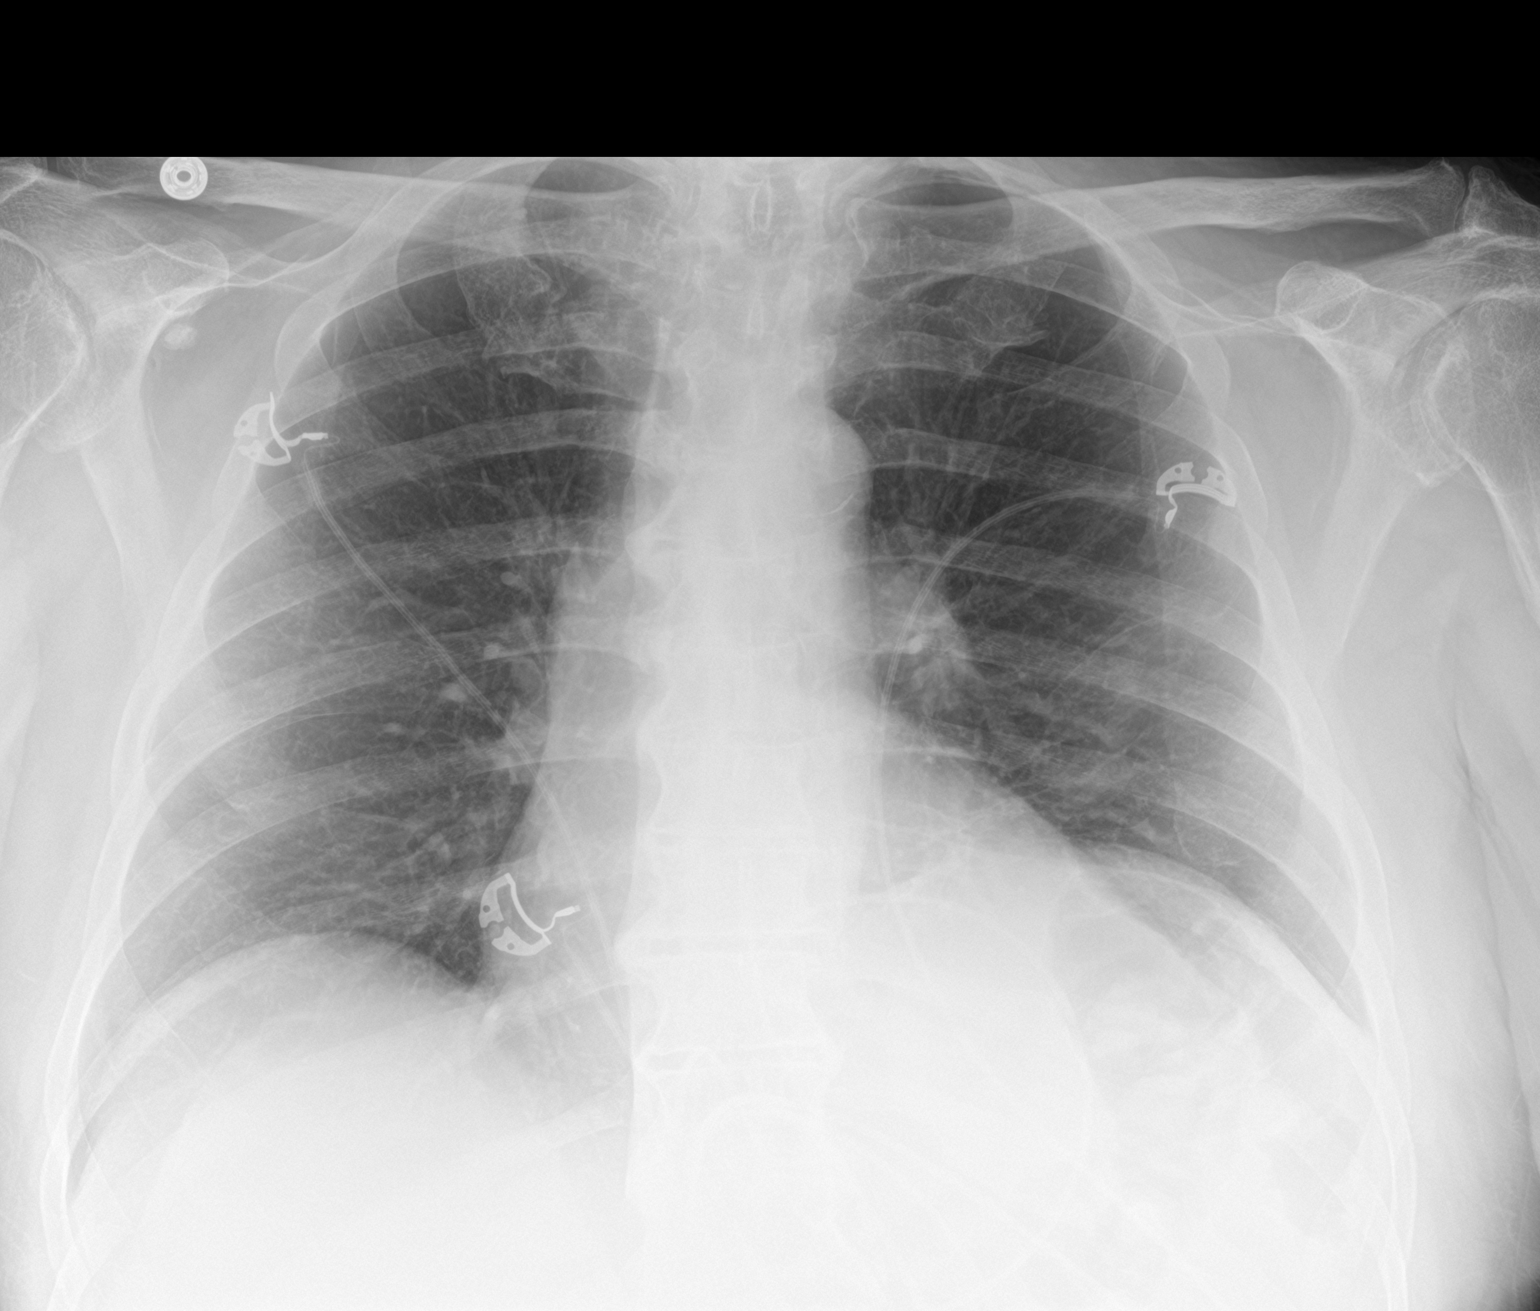

[2 of 2 positions shown; findings below may reference images not displayed]

FINDINGS: The heart size and mediastinal contours are within normal limits.
Both lungs are clear. Stable elevated left hemidiaphragm is noted.
The visualized skeletal structures are unremarkable.
IMPRESSION: No active cardiopulmonary disease.

## 2020-08-18 IMAGING — DX DG CHEST 1V PORT
1 series · 1 of 1 positions shown · non-contrast
Comparison: 08/14/2019

CLINICAL DATA: Postoperative

EXAM:
PORTABLE CHEST 1 VIEW

[chest]
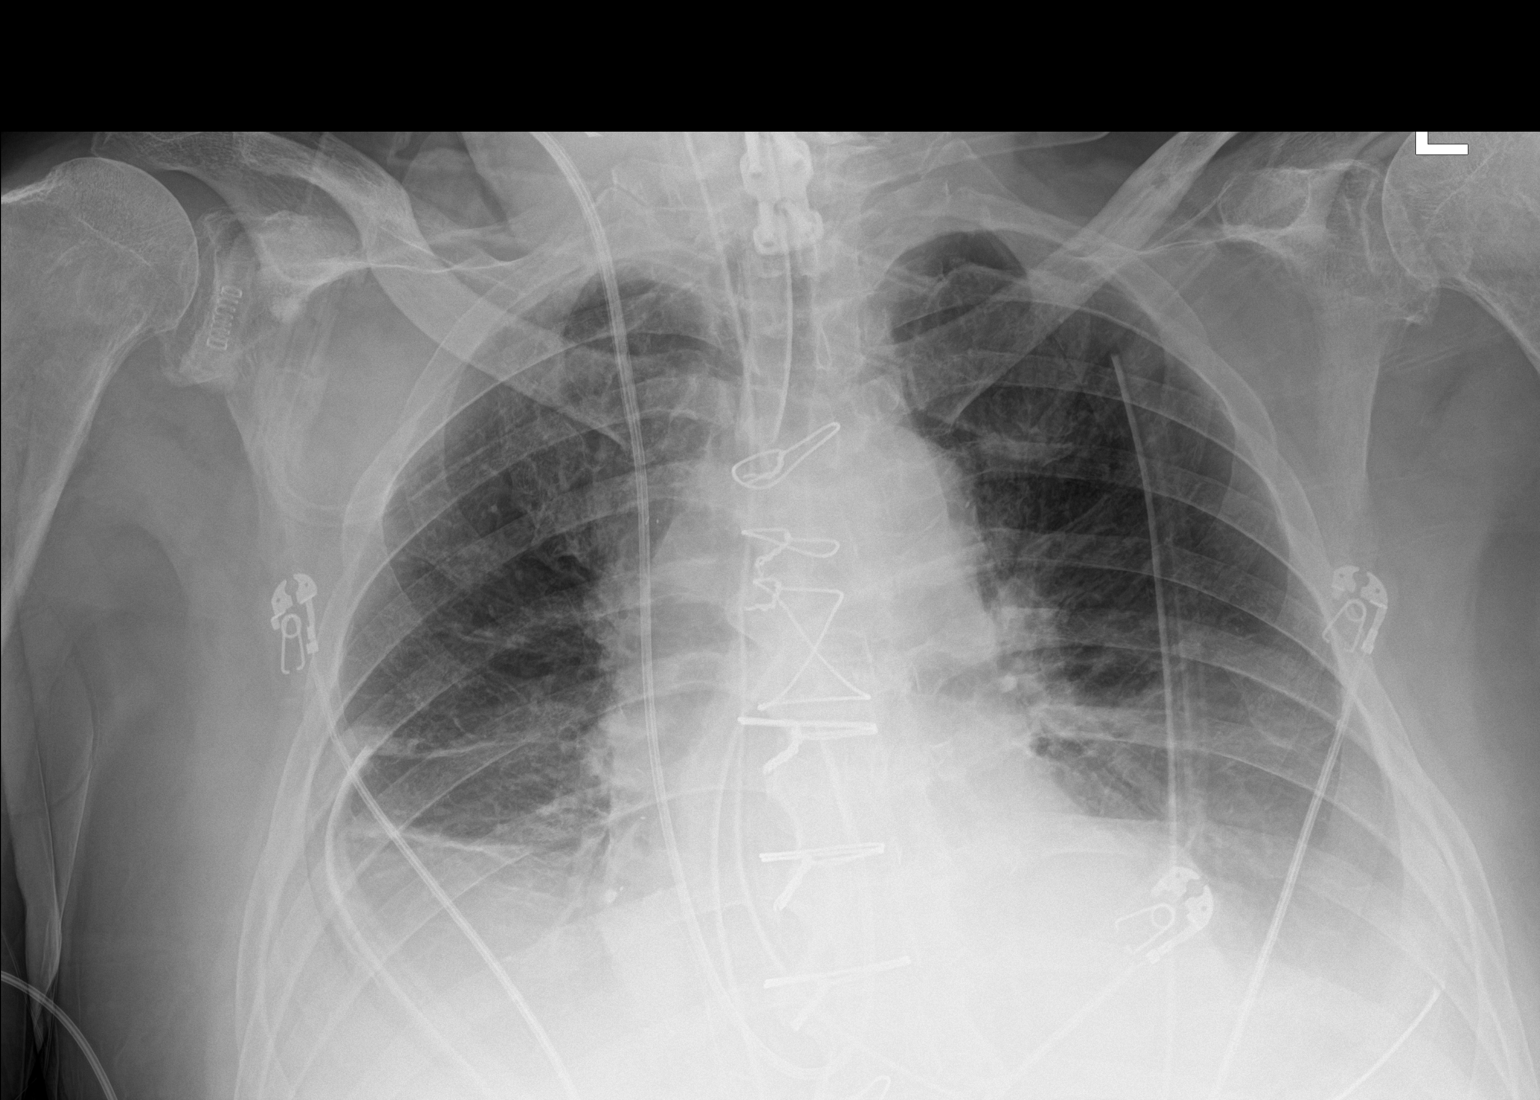

[1 of 1 positions shown; findings below may reference images not displayed]

FINDINGS: Interval postoperative findings of median sternotomy with
endotracheal tube projecting with tip over the mid trachea,
esophagogastric tube with tip and side port below the diaphragm, and
right neck pulmonary vascular catheter, tip projecting over the
pulmonary outflow tract. Bilateral chest and mediastinal drainage
tubes are in position without significant pneumothorax. Small,
layering bilateral pleural effusions and associated atelectasis.
IMPRESSION: 1. Interval postoperative findings of median sternotomy with
endotracheal tube projecting with tip over the mid trachea,
esophagogastric tube with tip and side port below the diaphragm, and
right neck pulmonary vascular catheter, tip projecting over the
pulmonary outflow tract.

2. Bilateral chest and mediastinal drainage tubes are in position
without significant pneumothorax. Small, layering bilateral pleural
effusions and associated atelectasis.

## 2020-09-27 ENCOUNTER — Other Ambulatory Visit: Payer: Self-pay

## 2020-09-27 NOTE — Patient Outreach (Signed)
  [facility]) Care Management Chronic Special Needs Program    09/27/2020  Name: Jencarlo, Bon[place]: 1941/11/16  MRN: [id number]   [name] is enrolled in a chronic special needs plan for Diabetes.  [place] Management will continue to provide services for this client through 11/19/2020. The [facility] [employer] care management team will assume care 11/20/2020.  [name] RN, Jackquline Denmark, CDE Chronic Care Management Coordinator [place] [facility] [phone]

## 2020-11-01 ENCOUNTER — Other Ambulatory Visit: Payer: Self-pay | Admitting: Cardiology

## 2020-11-01 DIAGNOSIS — I25810 Atherosclerosis of coronary artery bypass graft(s) without angina pectoris: Secondary | ICD-10-CM

## 2020-11-26 ENCOUNTER — Other Ambulatory Visit: Payer: Self-pay

## 2020-12-02 DIAGNOSIS — Z23 Encounter for immunization: Secondary | ICD-10-CM | POA: Diagnosis not present

## 2020-12-02 DIAGNOSIS — R718 Other abnormality of red blood cells: Secondary | ICD-10-CM | POA: Diagnosis not present

## 2020-12-02 DIAGNOSIS — D692 Other nonthrombocytopenic purpura: Secondary | ICD-10-CM | POA: Diagnosis not present

## 2020-12-02 DIAGNOSIS — R809 Proteinuria, unspecified: Secondary | ICD-10-CM | POA: Diagnosis not present

## 2020-12-02 DIAGNOSIS — I129 Hypertensive chronic kidney disease with stage 1 through stage 4 chronic kidney disease, or unspecified chronic kidney disease: Secondary | ICD-10-CM | POA: Diagnosis not present

## 2020-12-02 DIAGNOSIS — Z8616 Personal history of COVID-19: Secondary | ICD-10-CM | POA: Diagnosis not present

## 2020-12-02 DIAGNOSIS — N182 Chronic kidney disease, stage 2 (mild): Secondary | ICD-10-CM | POA: Diagnosis not present

## 2020-12-02 DIAGNOSIS — K760 Fatty (change of) liver, not elsewhere classified: Secondary | ICD-10-CM | POA: Diagnosis not present

## 2020-12-02 DIAGNOSIS — I25810 Atherosclerosis of coronary artery bypass graft(s) without angina pectoris: Secondary | ICD-10-CM | POA: Diagnosis not present

## 2020-12-02 DIAGNOSIS — I679 Cerebrovascular disease, unspecified: Secondary | ICD-10-CM | POA: Diagnosis not present

## 2020-12-02 DIAGNOSIS — Z6833 Body mass index (BMI) 33.0-33.9, adult: Secondary | ICD-10-CM | POA: Diagnosis not present

## 2020-12-02 DIAGNOSIS — I209 Angina pectoris, unspecified: Secondary | ICD-10-CM | POA: Diagnosis not present

## 2020-12-02 DIAGNOSIS — E7800 Pure hypercholesterolemia, unspecified: Secondary | ICD-10-CM | POA: Diagnosis not present

## 2020-12-02 DIAGNOSIS — E6609 Other obesity due to excess calories: Secondary | ICD-10-CM | POA: Diagnosis not present

## 2020-12-02 DIAGNOSIS — E1151 Type 2 diabetes mellitus with diabetic peripheral angiopathy without gangrene: Secondary | ICD-10-CM | POA: Diagnosis not present

## 2021-01-04 DIAGNOSIS — H353122 Nonexudative age-related macular degeneration, left eye, intermediate dry stage: Secondary | ICD-10-CM | POA: Diagnosis not present

## 2021-01-04 DIAGNOSIS — H353114 Nonexudative age-related macular degeneration, right eye, advanced atrophic with subfoveal involvement: Secondary | ICD-10-CM | POA: Diagnosis not present

## 2021-01-04 DIAGNOSIS — H35033 Hypertensive retinopathy, bilateral: Secondary | ICD-10-CM | POA: Diagnosis not present

## 2021-01-04 DIAGNOSIS — H35423 Microcystoid degeneration of retina, bilateral: Secondary | ICD-10-CM | POA: Diagnosis not present

## 2021-04-12 ENCOUNTER — Ambulatory Visit: Payer: HMO

## 2021-05-04 ENCOUNTER — Other Ambulatory Visit: Payer: Self-pay | Admitting: Cardiology

## 2021-05-04 DIAGNOSIS — I25810 Atherosclerosis of coronary artery bypass graft(s) without angina pectoris: Secondary | ICD-10-CM

## 2021-05-19 ENCOUNTER — Other Ambulatory Visit: Payer: Self-pay | Admitting: Registered Nurse

## 2021-05-19 DIAGNOSIS — R1011 Right upper quadrant pain: Secondary | ICD-10-CM

## 2021-05-19 DIAGNOSIS — K760 Fatty (change of) liver, not elsewhere classified: Secondary | ICD-10-CM | POA: Diagnosis not present

## 2021-05-20 ENCOUNTER — Ambulatory Visit
Admission: RE | Admit: 2021-05-20 | Discharge: 2021-05-20 | Disposition: A | Discharge status: Home / Self Care | Payer: HMO | Source: Ambulatory Visit | Attending: Registered Nurse | Admitting: Registered Nurse

## 2021-05-20 DIAGNOSIS — K8020 Calculus of gallbladder without cholecystitis without obstruction: Secondary | ICD-10-CM | POA: Diagnosis not present

## 2021-05-20 DIAGNOSIS — K760 Fatty (change of) liver, not elsewhere classified: Secondary | ICD-10-CM | POA: Diagnosis not present

## 2021-05-20 DIAGNOSIS — R1011 Right upper quadrant pain: Secondary | ICD-10-CM

## 2021-06-30 DIAGNOSIS — E7800 Pure hypercholesterolemia, unspecified: Secondary | ICD-10-CM | POA: Diagnosis not present

## 2021-06-30 DIAGNOSIS — E1151 Type 2 diabetes mellitus with diabetic peripheral angiopathy without gangrene: Secondary | ICD-10-CM | POA: Diagnosis not present

## 2021-06-30 DIAGNOSIS — Z125 Encounter for screening for malignant neoplasm of prostate: Secondary | ICD-10-CM | POA: Diagnosis not present

## 2021-07-05 DIAGNOSIS — H353114 Nonexudative age-related macular degeneration, right eye, advanced atrophic with subfoveal involvement: Secondary | ICD-10-CM | POA: Diagnosis not present

## 2021-07-05 DIAGNOSIS — H353123 Nonexudative age-related macular degeneration, left eye, advanced atrophic without subfoveal involvement: Secondary | ICD-10-CM | POA: Diagnosis not present

## 2021-07-05 DIAGNOSIS — H43813 Vitreous degeneration, bilateral: Secondary | ICD-10-CM | POA: Diagnosis not present

## 2021-07-05 DIAGNOSIS — H35433 Paving stone degeneration of retina, bilateral: Secondary | ICD-10-CM | POA: Diagnosis not present

## 2021-07-06 DIAGNOSIS — K8010 Calculus of gallbladder with chronic cholecystitis without obstruction: Secondary | ICD-10-CM | POA: Diagnosis not present

## 2021-07-07 DIAGNOSIS — Z0000 Encounter for general adult medical examination without abnormal findings: Secondary | ICD-10-CM | POA: Diagnosis not present

## 2021-07-07 DIAGNOSIS — E7800 Pure hypercholesterolemia, unspecified: Secondary | ICD-10-CM | POA: Diagnosis not present

## 2021-07-07 DIAGNOSIS — I25810 Atherosclerosis of coronary artery bypass graft(s) without angina pectoris: Secondary | ICD-10-CM | POA: Diagnosis not present

## 2021-07-07 DIAGNOSIS — E6609 Other obesity due to excess calories: Secondary | ICD-10-CM | POA: Diagnosis not present

## 2021-07-07 DIAGNOSIS — F331 Major depressive disorder, recurrent, moderate: Secondary | ICD-10-CM | POA: Diagnosis not present

## 2021-07-07 DIAGNOSIS — D692 Other nonthrombocytopenic purpura: Secondary | ICD-10-CM | POA: Diagnosis not present

## 2021-07-07 DIAGNOSIS — R82998 Other abnormal findings in urine: Secondary | ICD-10-CM | POA: Diagnosis not present

## 2021-07-07 DIAGNOSIS — Z1331 Encounter for screening for depression: Secondary | ICD-10-CM | POA: Diagnosis not present

## 2021-07-07 DIAGNOSIS — I209 Angina pectoris, unspecified: Secondary | ICD-10-CM | POA: Diagnosis not present

## 2021-07-07 DIAGNOSIS — N182 Chronic kidney disease, stage 2 (mild): Secondary | ICD-10-CM | POA: Diagnosis not present

## 2021-07-07 DIAGNOSIS — Z1339 Encounter for screening examination for other mental health and behavioral disorders: Secondary | ICD-10-CM | POA: Diagnosis not present

## 2021-07-07 DIAGNOSIS — Z6833 Body mass index (BMI) 33.0-33.9, adult: Secondary | ICD-10-CM | POA: Diagnosis not present

## 2021-07-07 DIAGNOSIS — K760 Fatty (change of) liver, not elsewhere classified: Secondary | ICD-10-CM | POA: Diagnosis not present

## 2021-07-07 DIAGNOSIS — I129 Hypertensive chronic kidney disease with stage 1 through stage 4 chronic kidney disease, or unspecified chronic kidney disease: Secondary | ICD-10-CM | POA: Diagnosis not present

## 2021-07-07 DIAGNOSIS — E1151 Type 2 diabetes mellitus with diabetic peripheral angiopathy without gangrene: Secondary | ICD-10-CM | POA: Diagnosis not present

## 2021-08-19 DIAGNOSIS — I129 Hypertensive chronic kidney disease with stage 1 through stage 4 chronic kidney disease, or unspecified chronic kidney disease: Secondary | ICD-10-CM | POA: Diagnosis not present

## 2021-08-19 DIAGNOSIS — N182 Chronic kidney disease, stage 2 (mild): Secondary | ICD-10-CM | POA: Diagnosis not present

## 2021-08-19 DIAGNOSIS — E7800 Pure hypercholesterolemia, unspecified: Secondary | ICD-10-CM | POA: Diagnosis not present

## 2021-08-19 DIAGNOSIS — M1990 Unspecified osteoarthritis, unspecified site: Secondary | ICD-10-CM | POA: Diagnosis not present

## 2021-08-24 ENCOUNTER — Other Ambulatory Visit: Payer: Self-pay

## 2021-08-24 ENCOUNTER — Ambulatory Visit: Payer: HMO | Admitting: Cardiology

## 2021-08-24 ENCOUNTER — Encounter: Payer: Self-pay | Admitting: Cardiology

## 2021-08-24 VITALS — BP 155/77 | HR 80 | Temp 98.0°F | Resp 16 | Ht 66.0 in | Wt 204.0 lb

## 2021-08-24 DIAGNOSIS — E119 Type 2 diabetes mellitus without complications: Secondary | ICD-10-CM

## 2021-08-24 DIAGNOSIS — I25810 Atherosclerosis of coronary artery bypass graft(s) without angina pectoris: Secondary | ICD-10-CM | POA: Diagnosis not present

## 2021-08-24 DIAGNOSIS — E782 Mixed hyperlipidemia: Secondary | ICD-10-CM | POA: Insufficient documentation

## 2021-08-24 DIAGNOSIS — I10 Essential (primary) hypertension: Secondary | ICD-10-CM | POA: Diagnosis not present

## 2021-08-24 MED ORDER — ASPIRIN EC 81 MG PO TBEC: 81.0000 mg | DELAYED_RELEASE_TABLET | Freq: Every day | ORAL | 3 refills | Status: AC

## 2021-08-24 MED ORDER — METOPROLOL SUCCINATE ER 50 MG PO TB24: 50.0000 mg | ORAL_TABLET | Freq: Every day | ORAL | 3 refills | Status: DC

## 2021-08-24 MED ORDER — ROSUVASTATIN CALCIUM 40 MG PO TABS: 40.0000 mg | ORAL_TABLET | Freq: Every day | ORAL | 3 refills | Status: DC

## 2021-08-24 NOTE — Progress Notes (Signed)
Patient is here for follow up visit.  Subjective:   Timothy [name], male    DOB: 1941/07/26, 80 y.o.   MRN: [id number]   Chief Complaint  Patient presents with   Hypertension   Coronary artery disease involving coronary bypass graft of    Follow-up    bypass    80 y/o Caucasian male with hypertension, type 2 DM, former smoker, CSD s/p CABGX4 (LIMA-LAD, RIMA-PDA, SVG-OM, SVG-Diag) Dr Vickey Sages 08/14/2019.  Today's appointment was made to discuss perioperative risk stratification prior to gallbladder surgery.  However, patient tells me today that he is unsure when he will have the gallbladder surgery, as he is not very symptomatic at this time.  Regardless, patient is staying active.  He denies any chest pain, shortness of breath symptoms.  Blood pressure is elevated today.  Further questioning suggest that blood pressure stays 140-150/70-80 mmHg at home.  He is not taking aspirin or Xarelto at this time.  He is only taking metoprolol tartrate once daily.  Reviewed recent lab results with the patient, details below.  Current Outpatient Medications on File Prior to Visit  Medication Sig Dispense Refill   amLODipine (NORVASC) 10 MG tablet Take 10 mg by mouth at bedtime.     metFORMIN (GLUCOPHAGE) 850 MG tablet Take 0.5 mg by mouth daily.      metoprolol tartrate (LOPRESSOR) 25 MG tablet Take 1 tablet (25 mg total) by mouth 2 (two) times daily. 60 tablet 1   omeprazole (PRILOSEC) 20 MG capsule Take 1 capsule (20 mg total) by mouth 2 (two) times daily. Take on an empty stomach 20 - 30 minutes prior to food. (Patient taking differently: Take 20 mg by mouth daily. Take on an empty stomach 20 - 30 minutes prior to food.) 180 capsule 3   rosuvastatin (CRESTOR) 20 MG tablet Take 0.5 tablets (10 mg total) by mouth daily at 6 PM. 30 tablet 2   sertraline (ZOLOFT) 100 MG tablet Take 100 mg by mouth daily.     tamsulosin (FLOMAX) 0.4 MG CAPS capsule TAKE 1 CAPSULE BY MOUTH EVERY DAY AT BEDTIME      olmesartan (BENICAR) 40 MG tablet Take 40 mg by mouth daily.     No current facility-administered medications on file prior to visit.    Cardiovascular studies:  EKG 03/05/2020: Sinus rhythm 71 bpm. Normal EKG.  CABGX4 (LIMA-LAD< RIMA-PDA, SVG-OM, SVG-Diag) Dr Vickey Sages 08/14/2019  Coronary angiography 08/12/2019: LM: Distal LM calcific 80% stenosis LAD: Prox 40% stenosis LCx: Ostial 40% stenosis RCA: Moderately calcified vessel        Ostial calcific 60% stenosis        Distal RCA focal 40% stenosis        Ostial PDA 50% stenosis LVEDP normal  EKG 09/11/2019: Sinus  Rhythm 80 bpm.  Normal EKG.  Lexiscan myoview stress test 01/06/2019:  1. Lexiscan stress test with low level exercise was performed. Patient reached heart rate of 112 bpm which corresponds with 79% of the maximum predicted heart rate  No stress symptoms reported. Hemodynamic response was normal. In additions, the stress electrocardiogram showed sinus tachycardia, normal stress conduction, no stress arrhythmias and normal stress repolarization.   2. The overall quality of the study is good. There is no evidence of abnormal lung activity. Stress and rest SPECT images demonstrate homogeneous tracer distribution throughout the myocardium. Gated SPECT imaging reveals normal myocardial thickening and wall motion. The left ventricular ejection fraction was normal (51%).   3. Low risk study.  Abdominal Aortic Duplex  01/24/2019 The maximum aorta diameter is 1.86 cm (dist). No evidence of significant atherosclerotic plaque. Normal aortic and iliac artery veloxity. Normal IVC.  Echocardiogram 01/24/2019 :  Left ventricle cavity is normal in size. Moderate concentric hypertrophy of the left ventricle. Normal global wall motion. Normal diastolic filling pattern. Calculated EF 55%. Left atrial cavity is slightly dilated at 4.5 cm. Structurally normal tricuspid valve with trace regurgitation. The aortic root is mildly dilated at  3.9 cm.   Recent labs: 06/30/2021: Glucose 129, BUN/Cr 19/1.0. EGFR 64.  HbA1C 6.2% Chol 151, TG 109, HDL 41, LDL 88  08/18/2019: Glucose 123, BUN/Cr 15/0.99. EGFR >60. Na/K 138/3.8.  H/H 8.5/26. MCV 87. Platelets 123 HbA1C 6.4%  05/2019: Chol 182, TG 186, HDL 40, LDL 105  Labs 12/11/2018: Glucose 97. BUN/Cr 22/0.8. eGFR 69/83.  Chol 140, TG 126, HDL 61, LDL 54.  Review of Systems  Cardiovascular:  Negative for chest pain, dyspnea on exertion, leg swelling, palpitations and syncope.      Objective:    Vitals:   08/24/21 1440  BP: (!) 155/77  Pulse: 80  Resp: 16  Temp: 98 F (36.7 C)  SpO2: 98%     Physical Exam Vitals and nursing note reviewed.  Constitutional:      Appearance: He is well-developed.  Neck:     Vascular: No JVD.  Cardiovascular:     Rate and Rhythm: Normal rate and regular rhythm.     Pulses: Normal pulses and intact distal pulses.     Heart sounds: Normal heart sounds. No murmur heard. Pulmonary:     Effort: Pulmonary effort is normal.     Breath sounds: Normal breath sounds. No wheezing or rales.  Musculoskeletal:     Right lower leg: No edema.     Left lower leg: No edema.        Assessment & Recommendations:    80 y/o Caucasian male with hypertension, type 2 DM, former smoker, CSD s/p CABGX4 (LIMA-LAD< RIMA-PDA, SVG-OM, SVG-Diag) Dr [name] 08/14/2019.  CAD s/p CABG: No angina symptoms. Resume aspirin 81 mg daily. Please metoprolol tartrate 25 mg-which she was taking once daily, to metoprolol succinate 50 mg once daily. Increase Crestor to 40 mg daily.  Repeat lipid panel in 3 months.  Cardiac risk stratification: Overall, low cardiac risk for gallbladder surgery, in the near future.  Essential hypertension: As above  Mixed hyperlipidemia: LDL 88.  Increase Crestor to 40 mg daily.  Repeat lipid panel in 3 months.  F/u in 3 months  [place], MD [facility] [place] Pager: [phone] Office:  [phone] If no answer Cell [phone]

## 2021-09-15 ENCOUNTER — Ambulatory Visit: Payer: HMO | Admitting: Cardiology

## 2021-12-09 ENCOUNTER — Ambulatory Visit: Payer: HMO | Admitting: Cardiology

## 2021-12-09 NOTE — Progress Notes (Incomplete)
Patient is here for follow up visit.  Subjective:   Timothy [name], male    DOB: 09-Jul-1941, 81 y.o.   MRN: [id number]   No chief complaint on file.   81 y/o Caucasian male with hypertension, type 2 DM, former smoker, CSD s/p CABGX4 (LIMA-LAD, RIMA-PDA, SVG-OM, SVG-Diag) Dr [name] 08/14/2019.  ***  Current Outpatient Medications on File Prior to Visit  Medication Sig Dispense Refill   amLODipine (NORVASC) 10 MG tablet Take 10 mg by mouth at bedtime.     aspirin EC 81 MG tablet Take 1 tablet (81 mg total) by mouth daily. 90 tablet 3   metFORMIN (GLUCOPHAGE) 850 MG tablet Take 0.5 mg by mouth daily.      metoprolol succinate (TOPROL-XL) 50 MG 24 hr tablet Take 1 tablet (50 mg total) by mouth daily. Take with or immediately following a meal. 90 tablet 3   olmesartan (BENICAR) 40 MG tablet Take 40 mg by mouth daily.     omeprazole (PRILOSEC) 20 MG capsule Take 1 capsule (20 mg total) by mouth 2 (two) times daily. Take on an empty stomach 20 - 30 minutes prior to food. (Patient taking differently: Take 20 mg by mouth daily. Take on an empty stomach 20 - 30 minutes prior to food.) 180 capsule 3   rosuvastatin (CRESTOR) 40 MG tablet Take 1 tablet (40 mg total) by mouth daily at 6 PM. 90 tablet 3   sertraline (ZOLOFT) 100 MG tablet Take 100 mg by mouth daily.     tamsulosin (FLOMAX) 0.4 MG CAPS capsule TAKE 1 CAPSULE BY MOUTH EVERY DAY AT BEDTIME     No current facility-administered medications on file prior to visit.    Cardiovascular studies:  EKG 03/05/2020: Sinus rhythm 71 bpm. Normal EKG.  CABGX4 (LIMA-LAD< RIMA-PDA, SVG-OM, SVG-Diag) Dr [name] 08/14/2019  Coronary angiography 08/12/2019: LM: Distal LM calcific 80% stenosis LAD: Prox 40% stenosis LCx: Ostial 40% stenosis RCA: Moderately calcified vessel        Ostial calcific 60% stenosis        Distal RCA focal 40% stenosis        Ostial PDA 50% stenosis LVEDP normal  EKG 09/11/2019: Sinus  Rhythm 80 bpm.   Normal EKG.  Lexiscan myoview stress test 01/06/2019:  1. Lexiscan stress test with low level exercise was performed. Patient reached heart rate of 112 bpm which corresponds with 79% of the maximum predicted heart rate  No stress symptoms reported. Hemodynamic response was normal. In additions, the stress electrocardiogram showed sinus tachycardia, normal stress conduction, no stress arrhythmias and normal stress repolarization.   2. The overall quality of the study is good. There is no evidence of abnormal lung activity. Stress and rest SPECT images demonstrate homogeneous tracer distribution throughout the myocardium. Gated SPECT imaging reveals normal myocardial thickening and wall motion. The left ventricular ejection fraction was normal (51%).   3. Low risk study.    Abdominal Aortic Duplex  01/24/2019 The maximum aorta diameter is 1.86 cm (dist). No evidence of significant atherosclerotic plaque. Normal aortic and iliac artery veloxity. Normal IVC.  Echocardiogram 01/24/2019 :  Left ventricle cavity is normal in size. Moderate concentric hypertrophy of the left ventricle. Normal global wall motion. Normal diastolic filling pattern. Calculated EF 55%. Left atrial cavity is slightly dilated at 4.5 cm. Structurally normal tricuspid valve with trace regurgitation. The aortic root is mildly dilated at 3.9 cm.   Recent labs: 06/30/2021: Glucose 129, BUN/Cr 19/1.0. EGFR 64.  HbA1C 6.2%  Chol 151, TG 109, HDL 41, LDL 88  08/18/2019: Glucose 123, BUN/Cr 15/0.99. EGFR >60. Na/K 138/3.8.  H/H 8.5/26. MCV 87. Platelets 123 HbA1C 6.4%  05/2019: Chol 182, TG 186, HDL 40, LDL 105  Labs 12/11/2018: Glucose 97. BUN/Cr 22/0.8. eGFR 69/83.  Chol 140, TG 126, HDL 61, LDL 54.  Review of Systems  Cardiovascular:  Negative for chest pain, dyspnea on exertion, leg swelling, palpitations and syncope.      Objective:    There were no vitals filed for this visit.    Physical Exam Vitals and  nursing note reviewed.  Constitutional:      Appearance: He is well-developed.  Neck:     Vascular: No JVD.  Cardiovascular:     Rate and Rhythm: Normal rate and regular rhythm.     Pulses: Normal pulses and intact distal pulses.     Heart sounds: Normal heart sounds. No murmur heard. Pulmonary:     Effort: Pulmonary effort is normal.     Breath sounds: Normal breath sounds. No wheezing or rales.  Musculoskeletal:     Right lower leg: No edema.     Left lower leg: No edema.        Assessment & Recommendations:    81 y/o Caucasian male with hypertension, type 2 DM, former smoker, CSD s/p CABGX4 (LIMA-LAD< RIMA-PDA, SVG-OM, SVG-Diag) Dr [name] 08/14/2019.  *** CAD s/p CABG: No angina symptoms. Resume aspirin 81 mg daily. Please metoprolol tartrate 25 mg-which she was taking once daily, to metoprolol succinate 50 mg once daily. Increase Crestor to 40 mg daily.  Repeat lipid panel in 3 months.  *** Cardiac risk stratification: Overall, low cardiac risk for gallbladder surgery, in the near future.  *** Essential hypertension: As above  *** Mixed hyperlipidemia: LDL 88.  Increase Crestor to 40 mg daily.  Repeat lipid panel in 3 months.  *** F/u in 3 months  [place], MD [facility] Pager: [phone] Office: [phone] If no answer Cell [phone]

## 2021-12-23 DIAGNOSIS — I25810 Atherosclerosis of coronary artery bypass graft(s) without angina pectoris: Secondary | ICD-10-CM | POA: Diagnosis not present

## 2021-12-24 LAB — LIPID PANEL
Chol/HDL Ratio: 2.7 ratio (ref 0.0–5.0)
Cholesterol, Total: 112 mg/dL (ref 100–199)
HDL: 42 mg/dL (ref >39)
LDL Chol Calc (NIH): 53 mg/dL (ref 0–99)
Triglycerides: 86 mg/dL (ref 0–149)
VLDL Cholesterol Cal: 17 mg/dL (ref 5–40)

## 2021-12-26 ENCOUNTER — Ambulatory Visit: Payer: HMO | Admitting: Cardiology

## 2022-01-05 ENCOUNTER — Other Ambulatory Visit: Payer: Self-pay

## 2022-01-05 ENCOUNTER — Ambulatory Visit: Payer: HMO | Admitting: Cardiology

## 2022-01-05 ENCOUNTER — Encounter: Payer: Self-pay | Admitting: Cardiology

## 2022-01-05 VITALS — BP 158/79 | HR 68 | Temp 98.2°F | Ht 66.0 in | Wt 211.0 lb

## 2022-01-05 DIAGNOSIS — E119 Type 2 diabetes mellitus without complications: Secondary | ICD-10-CM | POA: Diagnosis not present

## 2022-01-05 DIAGNOSIS — I10 Essential (primary) hypertension: Secondary | ICD-10-CM

## 2022-01-05 DIAGNOSIS — I25810 Atherosclerosis of coronary artery bypass graft(s) without angina pectoris: Secondary | ICD-10-CM | POA: Diagnosis not present

## 2022-01-05 MED ORDER — ROSUVASTATIN CALCIUM 40 MG PO TABS: 40.0000 mg | ORAL_TABLET | Freq: Every day | ORAL | 3 refills | Status: DC

## 2022-01-05 NOTE — Progress Notes (Signed)
Patient is here for follow up visit.  Subjective:   Timothy [name], male    DOB: [birth year], 81 y.o.   MRN: [id number]   Chief Complaint  Patient presents with   Coronary Artery Disease   Follow-up    81 y/o Caucasian male with hypertension, type 2 DM, former smoker, CSD s/p CABGX4 (LIMA-LAD, RIMA-PDA, SVG-OM, SVG-Diag) Dr Vickey Sages 08/14/2019.  Patient is doing well, denies symptoms today.  Reviewed recent lipid panel results with the patient, details below.  Blood pressure elevated today,.  Does not check it regularly at home.    Current Outpatient Medications on File Prior to Visit  Medication Sig Dispense Refill   amLODipine (NORVASC) 10 MG tablet Take 10 mg by mouth at bedtime.     aspirin EC 81 MG tablet Take 1 tablet (81 mg total) by mouth daily. 90 tablet 3   metFORMIN (GLUCOPHAGE) 850 MG tablet Take 0.5 mg by mouth daily.      metoprolol succinate (TOPROL-XL) 50 MG 24 hr tablet Take 1 tablet (50 mg total) by mouth daily. Take with or immediately following a meal. 90 tablet 3   omeprazole (PRILOSEC) 20 MG capsule Take 1 capsule (20 mg total) by mouth 2 (two) times daily. Take on an empty stomach 20 - 30 minutes prior to food. (Patient taking differently: Take 20 mg by mouth daily. Take on an empty stomach 20 - 30 minutes prior to food.) 180 capsule 3   rosuvastatin (CRESTOR) 40 MG tablet Take 1 tablet (40 mg total) by mouth daily at 6 PM. 90 tablet 3   sertraline (ZOLOFT) 100 MG tablet Take 100 mg by mouth daily.     tamsulosin (FLOMAX) 0.4 MG CAPS capsule TAKE 1 CAPSULE BY MOUTH EVERY DAY AT BEDTIME     olmesartan (BENICAR) 40 MG tablet Take 40 mg by mouth daily. (Patient not taking: Reported on 01/05/2022)     No current facility-administered medications on file prior to visit.    Cardiovascular studies:  EKG 01/05/2022: Sinus rhythm 61 bpm Normal EKG  EKG 03/05/2020: Sinus rhythm 71 bpm. Normal EKG.  CABGX4 (LIMA-LAD< RIMA-PDA, SVG-OM, SVG-Diag) Dr Vickey Sages  08/14/2019  Coronary angiography 08/12/2019: LM: Distal LM calcific 80% stenosis LAD: Prox 40% stenosis LCx: Ostial 40% stenosis RCA: Moderately calcified vessel        Ostial calcific 60% stenosis        Distal RCA focal 40% stenosis        Ostial PDA 50% stenosis LVEDP normal  EKG 09/11/2019: Sinus  Rhythm 80 bpm.  Normal EKG.  Lexiscan myoview stress test 01/06/2019:  1. Lexiscan stress test with low level exercise was performed. Patient reached heart rate of 112 bpm which corresponds with 79% of the maximum predicted heart rate  No stress symptoms reported. Hemodynamic response was normal. In additions, the stress electrocardiogram showed sinus tachycardia, normal stress conduction, no stress arrhythmias and normal stress repolarization.   2. The overall quality of the study is good. There is no evidence of abnormal lung activity. Stress and rest SPECT images demonstrate homogeneous tracer distribution throughout the myocardium. Gated SPECT imaging reveals normal myocardial thickening and wall motion. The left ventricular ejection fraction was normal (51%).   3. Low risk study.    Abdominal Aortic Duplex  01/24/2019 The maximum aorta diameter is 1.86 cm (dist). No evidence of significant atherosclerotic plaque. Normal aortic and iliac artery veloxity. Normal IVC.  Echocardiogram 01/24/2019 :  Left ventricle cavity is normal in size. Moderate  concentric hypertrophy of the left ventricle. Normal global wall motion. Normal diastolic filling pattern. Calculated EF 55%. Left atrial cavity is slightly dilated at 4.5 cm. Structurally normal tricuspid valve with trace regurgitation. The aortic root is mildly dilated at 3.9 cm.   Recent labs: 12/23/2021: Chol 112, TG 86, HDL 42, LDL 53  06/30/2021: Glucose 129, BUN/Cr 19/1.0. EGFR 64.  HbA1C 6.2% Chol 151, TG 109, HDL 41, LDL 88  08/18/2019: Glucose 123, BUN/Cr 15/0.99. EGFR >60. Na/K 138/3.8.  H/H 8.5/26. MCV 87. Platelets 123 HbA1C  6.4%  05/2019: Chol 182, TG 186, HDL 40, LDL 105  Labs 12/11/2018: Glucose 97. BUN/Cr 22/0.8. eGFR 69/83.  Chol 140, TG 126, HDL 61, LDL 54.  Review of Systems  Cardiovascular:  Negative for chest pain, dyspnea on exertion, leg swelling, palpitations and syncope.      Objective:    Vitals:   01/05/22 1328 01/05/22 1336  BP: (!) 167/72 (!) 158/79  Pulse: 69 68  Temp: 98.2 F (36.8 C)   SpO2: 96% 96%     Physical Exam Vitals and nursing note reviewed.  Constitutional:      Appearance: He is well-developed.  Neck:     Vascular: No JVD.  Cardiovascular:     Rate and Rhythm: Normal rate and regular rhythm.     Pulses: Normal pulses and intact distal pulses.     Heart sounds: Normal heart sounds. No murmur heard. Pulmonary:     Effort: Pulmonary effort is normal.     Breath sounds: Normal breath sounds. No wheezing or rales.  Musculoskeletal:     Right lower leg: No edema.     Left lower leg: No edema.        Assessment & Recommendations:    81 y/o Caucasian male with hypertension, type 2 DM, former smoker, CSD s/p CABGX4 (LIMA-LAD< RIMA-PDA, SVG-OM, SVG-Diag) Dr [name] 08/14/2019.  CAD s/p CABG: No angina symptoms. Continue aspirin 81 mg daily, metoprolol succinate 50 mg once daily-also being used for hypertension. Continue Crestor 40 mg daily. LDL very well controlled.  Essential hypertension: Continue olmesartan, amlodipine, and metoprolol. Recommend regular home monitoring of blood pressure. If SBP remains >140 mmHg, could consider adding hydralazine  Mixed hyperlipidemia: LDL down to 53 on Crestor to 40 mg daily.  Continue the same.  F/u in 3 months  [place], MD [facility] Pager: [phone] Office: [phone] If no answer Cell [phone]

## 2022-01-07 ENCOUNTER — Encounter: Payer: Self-pay | Admitting: Cardiology

## 2022-02-14 DIAGNOSIS — H35433 Paving stone degeneration of retina, bilateral: Secondary | ICD-10-CM | POA: Diagnosis not present

## 2022-02-14 DIAGNOSIS — H353114 Nonexudative age-related macular degeneration, right eye, advanced atrophic with subfoveal involvement: Secondary | ICD-10-CM | POA: Diagnosis not present

## 2022-02-14 DIAGNOSIS — H43813 Vitreous degeneration, bilateral: Secondary | ICD-10-CM | POA: Diagnosis not present

## 2022-02-14 DIAGNOSIS — H353123 Nonexudative age-related macular degeneration, left eye, advanced atrophic without subfoveal involvement: Secondary | ICD-10-CM | POA: Diagnosis not present

## 2022-02-27 DIAGNOSIS — N182 Chronic kidney disease, stage 2 (mild): Secondary | ICD-10-CM | POA: Diagnosis not present

## 2022-02-27 DIAGNOSIS — D692 Other nonthrombocytopenic purpura: Secondary | ICD-10-CM | POA: Diagnosis not present

## 2022-02-27 DIAGNOSIS — E1151 Type 2 diabetes mellitus with diabetic peripheral angiopathy without gangrene: Secondary | ICD-10-CM | POA: Diagnosis not present

## 2022-02-27 DIAGNOSIS — R809 Proteinuria, unspecified: Secondary | ICD-10-CM | POA: Diagnosis not present

## 2022-02-27 DIAGNOSIS — I129 Hypertensive chronic kidney disease with stage 1 through stage 4 chronic kidney disease, or unspecified chronic kidney disease: Secondary | ICD-10-CM | POA: Diagnosis not present

## 2022-02-27 DIAGNOSIS — I25810 Atherosclerosis of coronary artery bypass graft(s) without angina pectoris: Secondary | ICD-10-CM | POA: Diagnosis not present

## 2022-02-27 DIAGNOSIS — E7800 Pure hypercholesterolemia, unspecified: Secondary | ICD-10-CM | POA: Diagnosis not present

## 2022-02-27 DIAGNOSIS — Z1331 Encounter for screening for depression: Secondary | ICD-10-CM | POA: Diagnosis not present

## 2022-02-27 DIAGNOSIS — Z1339 Encounter for screening examination for other mental health and behavioral disorders: Secondary | ICD-10-CM | POA: Diagnosis not present

## 2022-02-27 DIAGNOSIS — F331 Major depressive disorder, recurrent, moderate: Secondary | ICD-10-CM | POA: Diagnosis not present

## 2022-02-27 DIAGNOSIS — M5459 Other low back pain: Secondary | ICD-10-CM | POA: Diagnosis not present

## 2022-02-27 DIAGNOSIS — K760 Fatty (change of) liver, not elsewhere classified: Secondary | ICD-10-CM | POA: Diagnosis not present

## 2022-04-21 DIAGNOSIS — M542 Cervicalgia: Secondary | ICD-10-CM | POA: Diagnosis not present

## 2022-07-05 ENCOUNTER — Encounter: Payer: Self-pay | Admitting: Cardiology

## 2022-07-05 ENCOUNTER — Ambulatory Visit: Payer: HMO | Admitting: Cardiology

## 2022-07-05 VITALS — BP 150/86 | HR 58 | Temp 98.3°F | Resp 16 | Ht 66.0 in | Wt 214.2 lb

## 2022-07-05 DIAGNOSIS — I25810 Atherosclerosis of coronary artery bypass graft(s) without angina pectoris: Secondary | ICD-10-CM | POA: Diagnosis not present

## 2022-07-05 DIAGNOSIS — I10 Essential (primary) hypertension: Secondary | ICD-10-CM

## 2022-07-05 NOTE — Progress Notes (Unsigned)
Patient is here for follow up visit.  Subjective:   Timothy [name], male    DOB: 1941/06/16, 81 y.o.   MRN: [id number]   Chief Complaint  Patient presents with   Coronary Artery Disease    81 y/o Caucasian male with hypertension, type 2 DM, former smoker, CSD s/p CABGX4 (LIMA-LAD, RIMA-PDA, SVG-OM, SVG-Diag) Dr Vickey Sages 08/14/2019.  Patient is doing well, denies symptoms today.  It appears that he has inadvertently come off   Current Outpatient Medications:    amLODipine (NORVASC) 10 MG tablet, Take 10 mg by mouth at bedtime., Disp: , Rfl:    aspirin EC 81 MG tablet, Take 1 tablet (81 mg total) by mouth daily., Disp: 90 tablet, Rfl: 3   metFORMIN (GLUCOPHAGE) 850 MG tablet, Take 0.5 mg by mouth daily. , Disp: , Rfl:    metoprolol succinate (TOPROL-XL) 50 MG 24 hr tablet, Take 1 tablet (50 mg total) by mouth daily. Take with or immediately following a meal., Disp: 90 tablet, Rfl: 3   omeprazole (PRILOSEC) 20 MG capsule, Take 1 capsule (20 mg total) by mouth 2 (two) times daily. Take on an empty stomach 20 - 30 minutes prior to food. (Patient taking differently: Take 20 mg by mouth daily. Take on an empty stomach 20 - 30 minutes prior to food.), Disp: 180 capsule, Rfl: 3   rosuvastatin (CRESTOR) 40 MG tablet, Take 1 tablet (40 mg total) by mouth daily at 6 PM., Disp: 90 tablet, Rfl: 3   sertraline (ZOLOFT) 100 MG tablet, Take 100 mg by mouth daily., Disp: , Rfl:    Cardiovascular studies:  EKG 01/05/2022: Sinus rhythm 61 bpm Normal EKG  EKG 03/05/2020: Sinus rhythm 71 bpm. Normal EKG.  CABGX4 (LIMA-LAD< RIMA-PDA, SVG-OM, SVG-Diag) Dr Vickey Sages 08/14/2019  Coronary angiography 08/12/2019: LM: Distal LM calcific 80% stenosis LAD: Prox 40% stenosis LCx: Ostial 40% stenosis RCA: Moderately calcified vessel        Ostial calcific 60% stenosis        Distal RCA focal 40% stenosis        Ostial PDA 50% stenosis LVEDP normal  EKG 09/11/2019: Sinus  Rhythm 80 bpm.  Normal  EKG.  Lexiscan myoview stress test 01/06/2019:  1. Lexiscan stress test with low level exercise was performed. Patient reached heart rate of 112 bpm which corresponds with 79% of the maximum predicted heart rate  No stress symptoms reported. Hemodynamic response was normal. In additions, the stress electrocardiogram showed sinus tachycardia, normal stress conduction, no stress arrhythmias and normal stress repolarization.   2. The overall quality of the study is good. There is no evidence of abnormal lung activity. Stress and rest SPECT images demonstrate homogeneous tracer distribution throughout the myocardium. Gated SPECT imaging reveals normal myocardial thickening and wall motion. The left ventricular ejection fraction was normal (51%).   3. Low risk study.    Abdominal Aortic Duplex  01/24/2019 The maximum aorta diameter is 1.86 cm (dist). No evidence of significant atherosclerotic plaque. Normal aortic and iliac artery veloxity. Normal IVC.  Echocardiogram 01/24/2019 :  Left ventricle cavity is normal in size. Moderate concentric hypertrophy of the left ventricle. Normal global wall motion. Normal diastolic filling pattern. Calculated EF 55%. Left atrial cavity is slightly dilated at 4.5 cm. Structurally normal tricuspid valve with trace regurgitation. The aortic root is mildly dilated at 3.9 cm.   Recent labs: 12/23/2021: Chol 112, TG 86, HDL 42, LDL 53  06/30/2021: Glucose 129, BUN/Cr 19/1.0. EGFR 64.  HbA1C 6.2%  Chol 151, TG 109, HDL 41, LDL 88  08/18/2019: Glucose 123, BUN/Cr 15/0.99. EGFR >60. Na/K 138/3.8.  H/H 8.5/26. MCV 87. Platelets 123 HbA1C 6.4%  05/2019: Chol 182, TG 186, HDL 40, LDL 105  Labs 12/11/2018: Glucose 97. BUN/Cr 22/0.8. eGFR 69/83.  Chol 140, TG 126, HDL 61, LDL 54.  Review of Systems  Cardiovascular:  Negative for chest pain, dyspnea on exertion, leg swelling, palpitations and syncope.       Objective:    Vitals:   07/05/22 1259 07/05/22 1305   BP: (!) 174/76 (!) 150/86  Pulse: 60 (!) 58  Resp: 16   Temp: 98.3 F (36.8 C)   SpO2: 95% 95%     Physical Exam Vitals and nursing note reviewed.  Constitutional:      Appearance: He is well-developed.  Neck:     Vascular: No JVD.  Cardiovascular:     Rate and Rhythm: Normal rate and regular rhythm.     Pulses: Normal pulses and intact distal pulses.     Heart sounds: Normal heart sounds. No murmur heard. Pulmonary:     Effort: Pulmonary effort is normal.     Breath sounds: Normal breath sounds. No wheezing or rales.  Musculoskeletal:     Right lower leg: No edema.     Left lower leg: No edema.         Assessment & Recommendations:    81 y/o Caucasian male with hypertension, type 2 DM, former smoker, CSD s/p CABGX4 (LIMA-LAD< RIMA-PDA, SVG-OM, SVG-Diag) Dr [name] 08/14/2019.  CAD s/p CABG: No angina symptoms. Continue aspirin 81 mg daily, metoprolol succinate 50 mg once daily-also being used for hypertension. Continue Crestor 40 mg daily. LDL very well controlled.  Essential hypertension: Continue olmesartan, amlodipine, and metoprolol. Recommend regular home monitoring of blood pressure. If SBP remains >140 mmHg, could consider adding hydralazine  Mixed hyperlipidemia: LDL down to 53 on Crestor to 40 mg daily.  Continue the same.  F/u in 3 months  [place], MD [facility] Pager: [phone] Office: [phone] If no answer Cell [phone]

## 2022-07-08 ENCOUNTER — Encounter: Payer: Self-pay | Admitting: Cardiology

## 2022-07-14 DIAGNOSIS — E1151 Type 2 diabetes mellitus with diabetic peripheral angiopathy without gangrene: Secondary | ICD-10-CM | POA: Diagnosis not present

## 2022-07-14 DIAGNOSIS — R7989 Other specified abnormal findings of blood chemistry: Secondary | ICD-10-CM | POA: Diagnosis not present

## 2022-07-14 DIAGNOSIS — E7800 Pure hypercholesterolemia, unspecified: Secondary | ICD-10-CM | POA: Diagnosis not present

## 2022-07-14 DIAGNOSIS — Z125 Encounter for screening for malignant neoplasm of prostate: Secondary | ICD-10-CM | POA: Diagnosis not present

## 2022-07-19 DIAGNOSIS — E1151 Type 2 diabetes mellitus with diabetic peripheral angiopathy without gangrene: Secondary | ICD-10-CM | POA: Diagnosis not present

## 2022-07-19 DIAGNOSIS — Z13828 Encounter for screening for other musculoskeletal disorder: Secondary | ICD-10-CM | POA: Diagnosis not present

## 2022-07-19 DIAGNOSIS — I25810 Atherosclerosis of coronary artery bypass graft(s) without angina pectoris: Secondary | ICD-10-CM | POA: Diagnosis not present

## 2022-07-19 DIAGNOSIS — E6609 Other obesity due to excess calories: Secondary | ICD-10-CM | POA: Diagnosis not present

## 2022-07-19 DIAGNOSIS — Z1331 Encounter for screening for depression: Secondary | ICD-10-CM | POA: Diagnosis not present

## 2022-07-19 DIAGNOSIS — R809 Proteinuria, unspecified: Secondary | ICD-10-CM | POA: Diagnosis not present

## 2022-07-19 DIAGNOSIS — K760 Fatty (change of) liver, not elsewhere classified: Secondary | ICD-10-CM | POA: Diagnosis not present

## 2022-07-19 DIAGNOSIS — N182 Chronic kidney disease, stage 2 (mild): Secondary | ICD-10-CM | POA: Diagnosis not present

## 2022-07-19 DIAGNOSIS — E7800 Pure hypercholesterolemia, unspecified: Secondary | ICD-10-CM | POA: Diagnosis not present

## 2022-07-19 DIAGNOSIS — I129 Hypertensive chronic kidney disease with stage 1 through stage 4 chronic kidney disease, or unspecified chronic kidney disease: Secondary | ICD-10-CM | POA: Diagnosis not present

## 2022-07-19 DIAGNOSIS — Z1339 Encounter for screening examination for other mental health and behavioral disorders: Secondary | ICD-10-CM | POA: Diagnosis not present

## 2022-07-19 DIAGNOSIS — D692 Other nonthrombocytopenic purpura: Secondary | ICD-10-CM | POA: Diagnosis not present

## 2022-07-19 DIAGNOSIS — I209 Angina pectoris, unspecified: Secondary | ICD-10-CM | POA: Diagnosis not present

## 2022-07-19 DIAGNOSIS — F331 Major depressive disorder, recurrent, moderate: Secondary | ICD-10-CM | POA: Diagnosis not present

## 2022-07-19 DIAGNOSIS — R82998 Other abnormal findings in urine: Secondary | ICD-10-CM | POA: Diagnosis not present

## 2022-07-19 DIAGNOSIS — Z0000 Encounter for general adult medical examination without abnormal findings: Secondary | ICD-10-CM | POA: Diagnosis not present

## 2022-08-25 ENCOUNTER — Ambulatory Visit: Payer: HMO | Admitting: Cardiology

## 2022-09-21 ENCOUNTER — Ambulatory Visit: Payer: HMO | Admitting: Cardiology

## 2022-09-21 ENCOUNTER — Encounter: Payer: Self-pay | Admitting: Cardiology

## 2022-09-21 VITALS — BP 158/77 | HR 62 | Resp 16 | Ht 66.0 in | Wt 203.0 lb

## 2022-09-21 DIAGNOSIS — E782 Mixed hyperlipidemia: Secondary | ICD-10-CM

## 2022-09-21 DIAGNOSIS — I10 Essential (primary) hypertension: Secondary | ICD-10-CM

## 2022-09-21 DIAGNOSIS — I25810 Atherosclerosis of coronary artery bypass graft(s) without angina pectoris: Secondary | ICD-10-CM

## 2022-09-21 MED ORDER — OLMESARTAN MEDOXOMIL 20 MG PO TABS: 20.0000 mg | ORAL_TABLET | Freq: Every day | ORAL | 5 refills | Status: AC

## 2022-09-21 NOTE — Progress Notes (Signed)
Patient is here for follow up visit.  Subjective:   Timothy [name], male    DOB: 1941-02-11, 81 y.o.   MRN: [id number]   Chief Complaint  Patient presents with   Hypertension   Follow-up    6 week    81 y/o Caucasian male with hypertension, type 2 DM, former smoker, CSD s/p CABGX4 (LIMA-LAD, RIMA-PDA, SVG-OM, SVG-Diag) Dr [name] 08/14/2019.  Patient is doing well, denies symptoms today.  Blood pressure remains elevated.    Current Outpatient Medications:    amLODipine (NORVASC) 10 MG tablet, Take 10 mg by mouth at bedtime., Disp: , Rfl:    aspirin EC 81 MG tablet, Take 1 tablet (81 mg total) by mouth daily., Disp: 90 tablet, Rfl: 3   metFORMIN (GLUCOPHAGE) 850 MG tablet, Take 0.5 mg by mouth daily. , Disp: , Rfl:    metoprolol succinate (TOPROL-XL) 50 MG 24 hr tablet, Take 1 tablet (50 mg total) by mouth daily. Take with or immediately following a meal., Disp: 90 tablet, Rfl: 3   omeprazole (PRILOSEC) 20 MG capsule, Take 1 capsule (20 mg total) by mouth 2 (two) times daily. Take on an empty stomach 20 - 30 minutes prior to food. (Patient taking differently: Take 20 mg by mouth daily. Take on an empty stomach 20 - 30 minutes prior to food.), Disp: 180 capsule, Rfl: 3   rosuvastatin (CRESTOR) 40 MG tablet, Take 1 tablet (40 mg total) by mouth daily at 6 PM., Disp: 90 tablet, Rfl: 3   sertraline (ZOLOFT) 100 MG tablet, Take 100 mg by mouth daily., Disp: , Rfl:    Cardiovascular studies:  EKG 01/05/2022: Sinus rhythm 61 bpm Normal EKG  CABGX4 (LIMA-LAD< RIMA-PDA, SVG-OM, SVG-Diag) Dr [name] 08/14/2019  Coronary angiography 08/12/2019: LM: Distal LM calcific 80% stenosis LAD: Prox 40% stenosis LCx: Ostial 40% stenosis RCA: Moderately calcified vessel        Ostial calcific 60% stenosis        Distal RCA focal 40% stenosis        Ostial PDA 50% stenosis LVEDP normal  Lexiscan myoview stress test 01/06/2019:  1. Lexiscan stress test with low level exercise was performed.  Patient reached heart rate of 112 bpm which corresponds with 79% of the maximum predicted heart rate  No stress symptoms reported. Hemodynamic response was normal. In additions, the stress electrocardiogram showed sinus tachycardia, normal stress conduction, no stress arrhythmias and normal stress repolarization.   2. The overall quality of the study is good. There is no evidence of abnormal lung activity. Stress and rest SPECT images demonstrate homogeneous tracer distribution throughout the myocardium. Gated SPECT imaging reveals normal myocardial thickening and wall motion. The left ventricular ejection fraction was normal (51%).   3. Low risk study.    Abdominal Aortic Duplex  01/24/2019 The maximum aorta diameter is 1.86 cm (dist). No evidence of significant atherosclerotic plaque. Normal aortic and iliac artery veloxity. Normal IVC.  Echocardiogram 01/24/2019 :  Left ventricle cavity is normal in size. Moderate concentric hypertrophy of the left ventricle. Normal global wall motion. Normal diastolic filling pattern. Calculated EF 55%. Left atrial cavity is slightly dilated at 4.5 cm. Structurally normal tricuspid valve with trace regurgitation. The aortic root is mildly dilated at 3.9 cm.   Recent labs: 12/23/2021: Chol 112, TG 86, HDL 42, LDL 53  06/30/2021: Glucose 129, BUN/Cr 19/1.0. EGFR 64.  HbA1C 6.2% Chol 151, TG 109, HDL 41, LDL 88  08/18/2019: Glucose 123, BUN/Cr 15/0.99. EGFR >60. Na/K  138/3.8.  H/H 8.5/26. MCV 87. Platelets 123 HbA1C 6.4%  05/2019: Chol 182, TG 186, HDL 40, LDL 105  Labs 12/11/2018: Glucose 97. BUN/Cr 22/0.8. eGFR 69/83.  Chol 140, TG 126, HDL 61, LDL 54.  Review of Systems  Cardiovascular:  Negative for chest pain, dyspnea on exertion, leg swelling, palpitations and syncope.       Objective:    Vitals:   09/21/22 0917  BP: (!) 158/77  Pulse: 62  Resp: 16  SpO2: 97%     Physical Exam Vitals and nursing note reviewed.  Constitutional:       Appearance: He is well-developed.  Neck:     Vascular: No JVD.  Cardiovascular:     Rate and Rhythm: Normal rate and regular rhythm.     Pulses: Normal pulses and intact distal pulses.     Heart sounds: Normal heart sounds. No murmur heard. Pulmonary:     Effort: Pulmonary effort is normal.     Breath sounds: Normal breath sounds. No wheezing or rales.  Musculoskeletal:     Right lower leg: No edema.     Left lower leg: No edema.         Assessment & Recommendations:    81 y/o Caucasian male with hypertension, type 2 DM, former smoker, CSD s/p CABGX4 (LIMA-LAD< RIMA-PDA, SVG-OM, SVG-Diag) Dr [name] 08/14/2019.  CAD s/p CABG: No angina symptoms. Continue aspirin 81 mg daily, metoprolol succinate 50 mg once daily-also being used for hypertension. Continue Crestor 40 mg daily. LDL very well controlled.  Essential hypertension: Uncontrolled.  Added olmesartan 20 mg daily. Check BMP in 1 week. Recommend regular home monitoring of blood pressure.  Mixed hyperlipidemia: LDL down to 53 on Crestor to 40 mg daily.  Continue the same.  F/u in 3 months    Timothy Mormon, MD Pager: [phone] Office: [phone]

## 2022-09-30 LAB — BASIC METABOLIC PANEL
BUN/Creatinine Ratio: 15 (ref 10–24)
BUN: 17 mg/dL (ref 8–27)
CO2: 24 mmol/L (ref 20–29)
Calcium: 9.1 mg/dL (ref 8.6–10.2)
Chloride: 104 mmol/L (ref 96–106)
Creatinine, Ser: 1.13 mg/dL (ref 0.76–1.27)
Glucose: 102 mg/dL — ABNORMAL HIGH (ref 70–99)
Potassium: 4.6 mmol/L (ref 3.5–5.2)
Sodium: 143 mmol/L (ref 134–144)
eGFR: 65 mL/min/{1.73_m2} (ref >59)

## 2022-10-17 ENCOUNTER — Other Ambulatory Visit: Payer: Self-pay | Admitting: Cardiology

## 2022-10-17 DIAGNOSIS — I25810 Atherosclerosis of coronary artery bypass graft(s) without angina pectoris: Secondary | ICD-10-CM

## 2022-12-25 ENCOUNTER — Encounter: Payer: Self-pay | Admitting: Cardiology

## 2022-12-25 ENCOUNTER — Ambulatory Visit: Payer: HMO | Admitting: Cardiology

## 2022-12-25 VITALS — BP 158/73 | HR 82 | Resp 16 | Ht 66.0 in | Wt 206.0 lb

## 2022-12-25 DIAGNOSIS — I10 Essential (primary) hypertension: Secondary | ICD-10-CM | POA: Diagnosis not present

## 2022-12-25 DIAGNOSIS — E782 Mixed hyperlipidemia: Secondary | ICD-10-CM | POA: Diagnosis not present

## 2022-12-25 DIAGNOSIS — I25810 Atherosclerosis of coronary artery bypass graft(s) without angina pectoris: Secondary | ICD-10-CM | POA: Diagnosis not present

## 2022-12-25 MED ORDER — METOPROLOL SUCCINATE ER 100 MG PO TB24: 100.0000 mg | ORAL_TABLET | Freq: Every day | ORAL | 2 refills | Status: DC

## 2022-12-25 NOTE — Progress Notes (Signed)
Patient is here for follow up visit.  Subjective:   Timothy [name], male    DOB: [birth year], 82 y.o.   MRN: [id number]   Chief Complaint  Patient presents with   Coronary Artery Disease   Hypertension   Follow-up    3 month    82 y/o Caucasian male with hypertension, type 2 DM, former smoker, CSD s/p CABGX4 (LIMA-LAD, RIMA-PDA, SVG-OM, SVG-Diag) Dr [name] 08/14/2019.  Patient is doing well, denies symptoms today.  Blood pressure remains elevated. He has stressors related to wife's health and wonders if it could be contributing.    Current Outpatient Medications:    amLODipine (NORVASC) 10 MG tablet, Take 10 mg by mouth at bedtime., Disp: , Rfl:    aspirin EC 81 MG tablet, Take 1 tablet (81 mg total) by mouth daily., Disp: 90 tablet, Rfl: 3   metFORMIN (GLUCOPHAGE) 850 MG tablet, Take 0.5 mg by mouth daily. , Disp: , Rfl:    metoprolol succinate (TOPROL-XL) 50 MG 24 hr tablet, TAKE 1 TABLET BY MOUTH ONCE DAILY. TAKE WITH OR IMMEDIATELY FOLLOWING A MEAL, Disp: 90 tablet, Rfl: 0   olmesartan (BENICAR) 20 MG tablet, Take 1 tablet (20 mg total) by mouth daily., Disp: 90 tablet, Rfl: 5   omeprazole (PRILOSEC) 20 MG capsule, Take 1 capsule (20 mg total) by mouth 2 (two) times daily. Take on an empty stomach 20 - 30 minutes prior to food. (Patient taking differently: Take 20 mg by mouth daily. Take on an empty stomach 20 - 30 minutes prior to food.), Disp: 180 capsule, Rfl: 3   rosuvastatin (CRESTOR) 40 MG tablet, Take 1 tablet (40 mg total) by mouth daily at 6 PM., Disp: 90 tablet, Rfl: 3   sertraline (ZOLOFT) 100 MG tablet, Take 100 mg by mouth daily., Disp: , Rfl:    Cardiovascular studies:  EKG 12/25/2022: Sinus rhythm 77 bpm Occasional PVC    CABGX4 (LIMA-LAD< RIMA-PDA, SVG-OM, SVG-Diag) Dr [name] 08/14/2019  Coronary angiography 08/12/2019: LM: Distal LM calcific 80% stenosis LAD: Prox 40% stenosis LCx: Ostial 40% stenosis RCA: Moderately calcified vessel        Ostial  calcific 60% stenosis        Distal RCA focal 40% stenosis        Ostial PDA 50% stenosis LVEDP normal  Lexiscan myoview stress test 01/06/2019:  1. Lexiscan stress test with low level exercise was performed. Patient reached heart rate of 112 bpm which corresponds with 79% of the maximum predicted heart rate  No stress symptoms reported. Hemodynamic response was normal. In additions, the stress electrocardiogram showed sinus tachycardia, normal stress conduction, no stress arrhythmias and normal stress repolarization.   2. The overall quality of the study is good. There is no evidence of abnormal lung activity. Stress and rest SPECT images demonstrate homogeneous tracer distribution throughout the myocardium. Gated SPECT imaging reveals normal myocardial thickening and wall motion. The left ventricular ejection fraction was normal (51%).   3. Low risk study.    Abdominal Aortic Duplex  01/24/2019 The maximum aorta diameter is 1.86 cm (dist). No evidence of significant atherosclerotic plaque. Normal aortic and iliac artery veloxity. Normal IVC.  Echocardiogram 01/24/2019 :  Left ventricle cavity is normal in size. Moderate concentric hypertrophy of the left ventricle. Normal global wall motion. Normal diastolic filling pattern. Calculated EF 55%. Left atrial cavity is slightly dilated at 4.5 cm. Structurally normal tricuspid valve with trace regurgitation. The aortic root is mildly dilated at 3.9 cm.  Recent labs: 09/29/2022: Glucose 102, BUN/Cr 17/1.13. EGFR 65. Na/K 143/4.6.   12/23/2021: Chol 112, TG 86, HDL 42, LDL 53  06/30/2021: Glucose 129, BUN/Cr 19/1.0. EGFR 64.  HbA1C 6.2% Chol 151, TG 109, HDL 41, LDL 88  Review of Systems  Cardiovascular:  Negative for chest pain, dyspnea on exertion, leg swelling, palpitations and syncope.       Objective:    Vitals:   12/25/22 1406  BP: (!) 158/73  Pulse: 82  Resp: 16  SpO2: 95%     Physical Exam Vitals and nursing note  reviewed.  Constitutional:      Appearance: He is well-developed.  Neck:     Vascular: No JVD.  Cardiovascular:     Rate and Rhythm: Normal rate and regular rhythm.     Pulses: Normal pulses and intact distal pulses.     Heart sounds: Normal heart sounds. No murmur heard. Pulmonary:     Effort: Pulmonary effort is normal.     Breath sounds: Normal breath sounds. No wheezing or rales.  Musculoskeletal:     Right lower leg: No edema.     Left lower leg: No edema.         Assessment & Recommendations:    82 y/o Caucasian male with hypertension, type 2 DM, former smoker, CSD s/p CABGX4 (LIMA-LAD< RIMA-PDA, SVG-OM, SVG-Diag) Dr [name] 08/14/2019.  CAD s/p CABG: No angina symptoms. Continue aspirin 81 mg daily, metoprolol succinate 50 mg once daily-also being used for hypertension. Continue Crestor 40 mg daily. LDL very well controlled.   Essential hypertension: Remains uncontrolled.  Currently on olmesartan 20 mg daily, metoprolol succinate 50 mg daily. Increase metoprolol succinate to 100 mg daily. In addition, recommend, stress management. He will discuss further with PCP.   Mixed hyperlipidemia: Well controlled.  F/u in 2 months    [name], MD Pager: [phone] Office: [phone]

## 2023-01-29 ENCOUNTER — Ambulatory Visit: Payer: HMO | Admitting: Cardiology

## 2023-02-05 ENCOUNTER — Ambulatory Visit: Payer: HMO | Admitting: Cardiology

## 2023-02-08 DIAGNOSIS — I25810 Atherosclerosis of coronary artery bypass graft(s) without angina pectoris: Secondary | ICD-10-CM | POA: Diagnosis not present

## 2023-02-08 DIAGNOSIS — E7800 Pure hypercholesterolemia, unspecified: Secondary | ICD-10-CM | POA: Diagnosis not present

## 2023-02-08 DIAGNOSIS — M545 Low back pain, unspecified: Secondary | ICD-10-CM | POA: Diagnosis not present

## 2023-02-08 DIAGNOSIS — H9193 Unspecified hearing loss, bilateral: Secondary | ICD-10-CM | POA: Diagnosis not present

## 2023-02-08 DIAGNOSIS — I209 Angina pectoris, unspecified: Secondary | ICD-10-CM | POA: Diagnosis not present

## 2023-02-08 DIAGNOSIS — N401 Enlarged prostate with lower urinary tract symptoms: Secondary | ICD-10-CM | POA: Diagnosis not present

## 2023-02-08 DIAGNOSIS — N182 Chronic kidney disease, stage 2 (mild): Secondary | ICD-10-CM | POA: Diagnosis not present

## 2023-02-08 DIAGNOSIS — L819 Disorder of pigmentation, unspecified: Secondary | ICD-10-CM | POA: Diagnosis not present

## 2023-02-08 DIAGNOSIS — E6609 Other obesity due to excess calories: Secondary | ICD-10-CM | POA: Diagnosis not present

## 2023-02-08 DIAGNOSIS — I129 Hypertensive chronic kidney disease with stage 1 through stage 4 chronic kidney disease, or unspecified chronic kidney disease: Secondary | ICD-10-CM | POA: Diagnosis not present

## 2023-02-08 DIAGNOSIS — K760 Fatty (change of) liver, not elsewhere classified: Secondary | ICD-10-CM | POA: Diagnosis not present

## 2023-02-08 DIAGNOSIS — E1151 Type 2 diabetes mellitus with diabetic peripheral angiopathy without gangrene: Secondary | ICD-10-CM | POA: Diagnosis not present

## 2023-02-14 ENCOUNTER — Encounter: Payer: HMO | Admitting: Cardiology

## 2023-02-14 NOTE — Progress Notes (Deleted)
Patient is here for follow up visit.  Subjective:   Timothy [name], male    DOB: [birth year], 82 y.o.   MRN: [id number]   No chief complaint on file.   82 y/o Caucasian male with hypertension, type 2 DM, former smoker, CSD s/p CABGX4 (LIMA-LAD, RIMA-PDA, SVG-OM, SVG-Diag) Dr [name] 08/14/2019.  Patient is doing well, denies symptoms today.  Blood pressure remains elevated. He has stressors related to wife's health and wonders if it could be contributing.    Current Outpatient Medications:    amLODipine (NORVASC) 10 MG tablet, Take 10 mg by mouth at bedtime., Disp: , Rfl:    aspirin EC 81 MG tablet, Take 1 tablet (81 mg total) by mouth daily., Disp: 90 tablet, Rfl: 3   metFORMIN (GLUCOPHAGE) 850 MG tablet, Take 0.5 mg by mouth daily. , Disp: , Rfl:    metoprolol succinate (TOPROL-XL) 100 MG 24 hr tablet, Take 1 tablet (100 mg total) by mouth daily. Take with or immediately following a meal., Disp: 30 tablet, Rfl: 2   olmesartan (BENICAR) 20 MG tablet, Take 1 tablet (20 mg total) by mouth daily., Disp: 90 tablet, Rfl: 5   omeprazole (PRILOSEC) 20 MG capsule, Take 1 capsule (20 mg total) by mouth 2 (two) times daily. Take on an empty stomach 20 - 30 minutes prior to food. (Patient taking differently: Take 20 mg by mouth daily. Take on an empty stomach 20 - 30 minutes prior to food.), Disp: 180 capsule, Rfl: 3   rosuvastatin (CRESTOR) 40 MG tablet, Take 1 tablet (40 mg total) by mouth daily at 6 PM., Disp: 90 tablet, Rfl: 3   sertraline (ZOLOFT) 100 MG tablet, Take 100 mg by mouth daily., Disp: , Rfl:    Cardiovascular studies:  EKG 12/25/2022: Sinus rhythm 77 bpm Occasional PVC    CABGX4 (LIMA-LAD< RIMA-PDA, SVG-OM, SVG-Diag) Dr [name] 08/14/2019  Coronary angiography 08/12/2019: LM: Distal LM calcific 80% stenosis LAD: Prox 40% stenosis LCx: Ostial 40% stenosis RCA: Moderately calcified vessel        Ostial calcific 60% stenosis        Distal RCA focal 40% stenosis         Ostial PDA 50% stenosis LVEDP normal  Lexiscan myoview stress test 01/06/2019:  1. Lexiscan stress test with low level exercise was performed. Patient reached heart rate of 112 bpm which corresponds with 79% of the maximum predicted heart rate  No stress symptoms reported. Hemodynamic response was normal. In additions, the stress electrocardiogram showed sinus tachycardia, normal stress conduction, no stress arrhythmias and normal stress repolarization.   2. The overall quality of the study is good. There is no evidence of abnormal lung activity. Stress and rest SPECT images demonstrate homogeneous tracer distribution throughout the myocardium. Gated SPECT imaging reveals normal myocardial thickening and wall motion. The left ventricular ejection fraction was normal (51%).   3. Low risk study.    Abdominal Aortic Duplex  01/24/2019 The maximum aorta diameter is 1.86 cm (dist). No evidence of significant atherosclerotic plaque. Normal aortic and iliac artery veloxity. Normal IVC.  Echocardiogram 01/24/2019 :  Left ventricle cavity is normal in size. Moderate concentric hypertrophy of the left ventricle. Normal global wall motion. Normal diastolic filling pattern. Calculated EF 55%. Left atrial cavity is slightly dilated at 4.5 cm. Structurally normal tricuspid valve with trace regurgitation. The aortic root is mildly dilated at 3.9 cm.   Recent labs: 09/29/2022: Glucose 102, BUN/Cr 17/1.13. EGFR 65. Na/K 143/4.6.   12/23/2021: Chol  112, TG 86, HDL 42, LDL 53  06/30/2021: Glucose 129, BUN/Cr 19/1.0. EGFR 64.  HbA1C 6.2% Chol 151, TG 109, HDL 41, LDL 88  Review of Systems  Cardiovascular:  Negative for chest pain, dyspnea on exertion, leg swelling, palpitations and syncope.       Objective:    There were no vitals filed for this visit.    Physical Exam Vitals and nursing note reviewed.  Constitutional:      Appearance: He is well-developed.  Neck:     Vascular: No JVD.   Cardiovascular:     Rate and Rhythm: Normal rate and regular rhythm.     Pulses: Normal pulses and intact distal pulses.     Heart sounds: Normal heart sounds. No murmur heard. Pulmonary:     Effort: Pulmonary effort is normal.     Breath sounds: Normal breath sounds. No wheezing or rales.  Musculoskeletal:     Right lower leg: No edema.     Left lower leg: No edema.         Assessment & Recommendations:    82 y/o Caucasian male with hypertension, type 2 DM, former smoker, CSD s/p CABGX4 (LIMA-LAD< RIMA-PDA, SVG-OM, SVG-Diag) Dr [name] 08/14/2019.  CAD s/p CABG: No angina symptoms. Continue aspirin 81 mg daily, metoprolol succinate 50 mg once daily-also being used for hypertension. Continue Crestor 40 mg daily. LDL very well controlled.  *** Essential hypertension: Remains uncontrolled.  Currently on olmesartan 20 mg daily, metoprolol succinate 50 mg daily. Increase metoprolol succinate to 100 mg daily. In addition, recommend, stress management. He will discuss further with PCP.   Mixed hyperlipidemia: Well controlled.  F/u in 2 months    [name], MD Pager: [phone] Office: [phone]

## 2023-04-12 ENCOUNTER — Other Ambulatory Visit: Payer: Self-pay | Admitting: Cardiology

## 2023-04-12 DIAGNOSIS — I25810 Atherosclerosis of coronary artery bypass graft(s) without angina pectoris: Secondary | ICD-10-CM

## 2023-05-14 DIAGNOSIS — I482 Chronic atrial fibrillation, unspecified: Secondary | ICD-10-CM | POA: Diagnosis not present

## 2023-05-26 ENCOUNTER — Other Ambulatory Visit: Payer: Self-pay | Admitting: Cardiology

## 2023-05-26 DIAGNOSIS — I25810 Atherosclerosis of coronary artery bypass graft(s) without angina pectoris: Secondary | ICD-10-CM

## 2023-07-25 DIAGNOSIS — E785 Hyperlipidemia, unspecified: Secondary | ICD-10-CM | POA: Diagnosis not present

## 2023-07-26 DIAGNOSIS — I129 Hypertensive chronic kidney disease with stage 1 through stage 4 chronic kidney disease, or unspecified chronic kidney disease: Secondary | ICD-10-CM | POA: Diagnosis not present

## 2023-07-26 DIAGNOSIS — N182 Chronic kidney disease, stage 2 (mild): Secondary | ICD-10-CM | POA: Diagnosis not present

## 2023-07-26 DIAGNOSIS — E7800 Pure hypercholesterolemia, unspecified: Secondary | ICD-10-CM | POA: Diagnosis not present

## 2023-07-26 DIAGNOSIS — E1151 Type 2 diabetes mellitus with diabetic peripheral angiopathy without gangrene: Secondary | ICD-10-CM | POA: Diagnosis not present

## 2023-07-26 DIAGNOSIS — N401 Enlarged prostate with lower urinary tract symptoms: Secondary | ICD-10-CM | POA: Diagnosis not present

## 2023-07-27 NOTE — Progress Notes (Signed)
This encounter was created in error - please disregard.

## 2023-07-30 DIAGNOSIS — I129 Hypertensive chronic kidney disease with stage 1 through stage 4 chronic kidney disease, or unspecified chronic kidney disease: Secondary | ICD-10-CM | POA: Diagnosis not present

## 2023-07-30 DIAGNOSIS — I25810 Atherosclerosis of coronary artery bypass graft(s) without angina pectoris: Secondary | ICD-10-CM | POA: Diagnosis not present

## 2023-07-30 DIAGNOSIS — D692 Other nonthrombocytopenic purpura: Secondary | ICD-10-CM | POA: Diagnosis not present

## 2023-07-30 DIAGNOSIS — E6609 Other obesity due to excess calories: Secondary | ICD-10-CM | POA: Diagnosis not present

## 2023-07-30 DIAGNOSIS — H3530 Unspecified macular degeneration: Secondary | ICD-10-CM | POA: Diagnosis not present

## 2023-07-30 DIAGNOSIS — Z1339 Encounter for screening examination for other mental health and behavioral disorders: Secondary | ICD-10-CM | POA: Diagnosis not present

## 2023-07-30 DIAGNOSIS — N182 Chronic kidney disease, stage 2 (mild): Secondary | ICD-10-CM | POA: Diagnosis not present

## 2023-07-30 DIAGNOSIS — N401 Enlarged prostate with lower urinary tract symptoms: Secondary | ICD-10-CM | POA: Diagnosis not present

## 2023-07-30 DIAGNOSIS — Z1331 Encounter for screening for depression: Secondary | ICD-10-CM | POA: Diagnosis not present

## 2023-07-30 DIAGNOSIS — L819 Disorder of pigmentation, unspecified: Secondary | ICD-10-CM | POA: Diagnosis not present

## 2023-07-30 DIAGNOSIS — Z0000 Encounter for general adult medical examination without abnormal findings: Secondary | ICD-10-CM | POA: Diagnosis not present

## 2023-07-30 DIAGNOSIS — I209 Angina pectoris, unspecified: Secondary | ICD-10-CM | POA: Diagnosis not present

## 2023-07-30 DIAGNOSIS — E1151 Type 2 diabetes mellitus with diabetic peripheral angiopathy without gangrene: Secondary | ICD-10-CM | POA: Diagnosis not present

## 2023-07-30 DIAGNOSIS — F331 Major depressive disorder, recurrent, moderate: Secondary | ICD-10-CM | POA: Diagnosis not present

## 2023-07-30 DIAGNOSIS — Z23 Encounter for immunization: Secondary | ICD-10-CM | POA: Diagnosis not present

## 2023-07-30 DIAGNOSIS — R82998 Other abnormal findings in urine: Secondary | ICD-10-CM | POA: Diagnosis not present

## 2023-08-28 DIAGNOSIS — R1310 Dysphagia, unspecified: Secondary | ICD-10-CM | POA: Diagnosis not present

## 2023-08-28 DIAGNOSIS — S32029A Unspecified fracture of second lumbar vertebra, initial encounter for closed fracture: Secondary | ICD-10-CM | POA: Diagnosis not present

## 2023-08-28 DIAGNOSIS — S32038A Other fracture of third lumbar vertebra, initial encounter for closed fracture: Secondary | ICD-10-CM | POA: Diagnosis not present

## 2023-08-28 DIAGNOSIS — S22068A Other fracture of T7-T8 thoracic vertebra, initial encounter for closed fracture: Secondary | ICD-10-CM | POA: Diagnosis not present

## 2023-08-28 DIAGNOSIS — I10 Essential (primary) hypertension: Secondary | ICD-10-CM | POA: Diagnosis not present

## 2023-08-28 DIAGNOSIS — S36899A Unspecified injury of other intra-abdominal organs, initial encounter: Secondary | ICD-10-CM | POA: Diagnosis not present

## 2023-08-28 DIAGNOSIS — S22079D Unspecified fracture of T9-T10 vertebra, subsequent encounter for fracture with routine healing: Secondary | ICD-10-CM | POA: Diagnosis not present

## 2023-08-28 DIAGNOSIS — S2241XA Multiple fractures of ribs, right side, initial encounter for closed fracture: Secondary | ICD-10-CM | POA: Diagnosis not present

## 2023-08-28 DIAGNOSIS — M546 Pain in thoracic spine: Secondary | ICD-10-CM | POA: Diagnosis not present

## 2023-08-28 DIAGNOSIS — Z8673 Personal history of transient ischemic attack (TIA), and cerebral infarction without residual deficits: Secondary | ICD-10-CM | POA: Diagnosis not present

## 2023-08-28 DIAGNOSIS — S22069A Unspecified fracture of T7-T8 vertebra, initial encounter for closed fracture: Secondary | ICD-10-CM | POA: Diagnosis not present

## 2023-08-28 DIAGNOSIS — S2231XA Fracture of one rib, right side, initial encounter for closed fracture: Secondary | ICD-10-CM | POA: Diagnosis not present

## 2023-08-28 DIAGNOSIS — M25561 Pain in right knee: Secondary | ICD-10-CM | POA: Diagnosis not present

## 2023-08-28 DIAGNOSIS — J189 Pneumonia, unspecified organism: Secondary | ICD-10-CM | POA: Diagnosis not present

## 2023-08-28 DIAGNOSIS — M4814 Ankylosing hyperostosis [Forestier], thoracic region: Secondary | ICD-10-CM | POA: Diagnosis not present

## 2023-08-28 DIAGNOSIS — S82874A Nondisplaced pilon fracture of right tibia, initial encounter for closed fracture: Secondary | ICD-10-CM | POA: Diagnosis not present

## 2023-08-28 DIAGNOSIS — Z7984 Long term (current) use of oral hypoglycemic drugs: Secondary | ICD-10-CM | POA: Diagnosis not present

## 2023-08-28 DIAGNOSIS — Z951 Presence of aortocoronary bypass graft: Secondary | ICD-10-CM | POA: Diagnosis not present

## 2023-08-28 DIAGNOSIS — S32019A Unspecified fracture of first lumbar vertebra, initial encounter for closed fracture: Secondary | ICD-10-CM | POA: Diagnosis not present

## 2023-08-28 DIAGNOSIS — M542 Cervicalgia: Secondary | ICD-10-CM | POA: Diagnosis not present

## 2023-08-28 DIAGNOSIS — S22068D Other fracture of T7-T8 thoracic vertebra, subsequent encounter for fracture with routine healing: Secondary | ICD-10-CM | POA: Diagnosis not present

## 2023-08-28 DIAGNOSIS — S22078A Other fracture of T9-T10 vertebra, initial encounter for closed fracture: Secondary | ICD-10-CM | POA: Diagnosis not present

## 2023-08-28 DIAGNOSIS — S82201A Unspecified fracture of shaft of right tibia, initial encounter for closed fracture: Secondary | ICD-10-CM | POA: Diagnosis not present

## 2023-08-28 DIAGNOSIS — S92121A Displaced fracture of body of right talus, initial encounter for closed fracture: Secondary | ICD-10-CM | POA: Diagnosis not present

## 2023-08-28 DIAGNOSIS — S233XXA Sprain of ligaments of thoracic spine, initial encounter: Secondary | ICD-10-CM | POA: Diagnosis not present

## 2023-08-28 DIAGNOSIS — S82871A Displaced pilon fracture of right tibia, initial encounter for closed fracture: Secondary | ICD-10-CM | POA: Diagnosis not present

## 2023-08-28 DIAGNOSIS — S82851A Displaced trimalleolar fracture of right lower leg, initial encounter for closed fracture: Secondary | ICD-10-CM | POA: Diagnosis not present

## 2023-08-28 DIAGNOSIS — E119 Type 2 diabetes mellitus without complications: Secondary | ICD-10-CM | POA: Diagnosis not present

## 2023-08-28 DIAGNOSIS — S3993XA Unspecified injury of pelvis, initial encounter: Secondary | ICD-10-CM | POA: Diagnosis not present

## 2023-08-28 DIAGNOSIS — D62 Acute posthemorrhagic anemia: Secondary | ICD-10-CM | POA: Diagnosis not present

## 2023-08-28 DIAGNOSIS — R0902 Hypoxemia: Secondary | ICD-10-CM | POA: Diagnosis not present

## 2023-08-28 DIAGNOSIS — S32018A Other fracture of first lumbar vertebra, initial encounter for closed fracture: Secondary | ICD-10-CM | POA: Diagnosis not present

## 2023-08-28 DIAGNOSIS — S22079A Unspecified fracture of T9-T10 vertebra, initial encounter for closed fracture: Secondary | ICD-10-CM | POA: Diagnosis not present

## 2023-08-28 DIAGNOSIS — Z743 Need for continuous supervision: Secondary | ICD-10-CM | POA: Diagnosis not present

## 2023-08-28 DIAGNOSIS — M79661 Pain in right lower leg: Secondary | ICD-10-CM | POA: Diagnosis not present

## 2023-08-28 DIAGNOSIS — S199XXA Unspecified injury of neck, initial encounter: Secondary | ICD-10-CM | POA: Diagnosis not present

## 2023-08-28 DIAGNOSIS — S22078D Other fracture of T9-T10 vertebra, subsequent encounter for fracture with routine healing: Secondary | ICD-10-CM | POA: Diagnosis not present

## 2023-08-28 DIAGNOSIS — S32028A Other fracture of second lumbar vertebra, initial encounter for closed fracture: Secondary | ICD-10-CM | POA: Diagnosis not present

## 2023-08-28 DIAGNOSIS — S82391A Other fracture of lower end of right tibia, initial encounter for closed fracture: Secondary | ICD-10-CM | POA: Diagnosis not present

## 2023-08-28 DIAGNOSIS — F32A Depression, unspecified: Secondary | ICD-10-CM | POA: Diagnosis not present

## 2023-08-28 DIAGNOSIS — M25461 Effusion, right knee: Secondary | ICD-10-CM | POA: Diagnosis not present

## 2023-08-28 DIAGNOSIS — R52 Pain, unspecified: Secondary | ICD-10-CM | POA: Diagnosis not present

## 2023-08-28 DIAGNOSIS — S0990XA Unspecified injury of head, initial encounter: Secondary | ICD-10-CM | POA: Diagnosis not present

## 2023-08-28 DIAGNOSIS — S82401A Unspecified fracture of shaft of right fibula, initial encounter for closed fracture: Secondary | ICD-10-CM | POA: Diagnosis not present

## 2023-08-28 DIAGNOSIS — S82831A Other fracture of upper and lower end of right fibula, initial encounter for closed fracture: Secondary | ICD-10-CM | POA: Diagnosis not present

## 2023-08-28 DIAGNOSIS — I2510 Atherosclerotic heart disease of native coronary artery without angina pectoris: Secondary | ICD-10-CM | POA: Diagnosis not present

## 2023-08-28 DIAGNOSIS — J90 Pleural effusion, not elsewhere classified: Secondary | ICD-10-CM | POA: Diagnosis not present

## 2023-08-28 DIAGNOSIS — M79671 Pain in right foot: Secondary | ICD-10-CM | POA: Diagnosis not present

## 2023-08-28 DIAGNOSIS — K661 Hemoperitoneum: Secondary | ICD-10-CM | POA: Diagnosis not present

## 2023-08-28 DIAGNOSIS — S32039A Unspecified fracture of third lumbar vertebra, initial encounter for closed fracture: Secondary | ICD-10-CM | POA: Diagnosis not present

## 2023-08-29 DIAGNOSIS — S22068A Other fracture of T7-T8 thoracic vertebra, initial encounter for closed fracture: Secondary | ICD-10-CM | POA: Diagnosis not present

## 2023-08-29 DIAGNOSIS — S22079A Unspecified fracture of T9-T10 vertebra, initial encounter for closed fracture: Secondary | ICD-10-CM | POA: Diagnosis not present

## 2023-08-29 DIAGNOSIS — M4814 Ankylosing hyperostosis [Forestier], thoracic region: Secondary | ICD-10-CM | POA: Diagnosis not present

## 2023-08-29 DIAGNOSIS — S22078A Other fracture of T9-T10 vertebra, initial encounter for closed fracture: Secondary | ICD-10-CM | POA: Diagnosis not present

## 2023-08-29 DIAGNOSIS — S82871A Displaced pilon fracture of right tibia, initial encounter for closed fracture: Secondary | ICD-10-CM | POA: Diagnosis not present

## 2023-08-29 DIAGNOSIS — S82391A Other fracture of lower end of right tibia, initial encounter for closed fracture: Secondary | ICD-10-CM | POA: Diagnosis not present

## 2023-08-30 DIAGNOSIS — S22068D Other fracture of T7-T8 thoracic vertebra, subsequent encounter for fracture with routine healing: Secondary | ICD-10-CM | POA: Diagnosis not present

## 2023-08-30 DIAGNOSIS — S22078D Other fracture of T9-T10 vertebra, subsequent encounter for fracture with routine healing: Secondary | ICD-10-CM | POA: Diagnosis not present

## 2023-08-30 DIAGNOSIS — S82871A Displaced pilon fracture of right tibia, initial encounter for closed fracture: Secondary | ICD-10-CM | POA: Diagnosis not present

## 2023-08-31 DIAGNOSIS — S82201A Unspecified fracture of shaft of right tibia, initial encounter for closed fracture: Secondary | ICD-10-CM | POA: Diagnosis not present

## 2023-08-31 DIAGNOSIS — R0902 Hypoxemia: Secondary | ICD-10-CM | POA: Diagnosis not present

## 2023-08-31 DIAGNOSIS — S82401A Unspecified fracture of shaft of right fibula, initial encounter for closed fracture: Secondary | ICD-10-CM | POA: Diagnosis not present

## 2023-08-31 DIAGNOSIS — S32039A Unspecified fracture of third lumbar vertebra, initial encounter for closed fracture: Secondary | ICD-10-CM | POA: Diagnosis not present

## 2023-08-31 DIAGNOSIS — J90 Pleural effusion, not elsewhere classified: Secondary | ICD-10-CM | POA: Diagnosis not present

## 2023-08-31 DIAGNOSIS — S2241XA Multiple fractures of ribs, right side, initial encounter for closed fracture: Secondary | ICD-10-CM | POA: Diagnosis not present

## 2023-08-31 DIAGNOSIS — S22079A Unspecified fracture of T9-T10 vertebra, initial encounter for closed fracture: Secondary | ICD-10-CM | POA: Diagnosis not present

## 2023-09-03 DIAGNOSIS — D62 Acute posthemorrhagic anemia: Secondary | ICD-10-CM | POA: Diagnosis not present

## 2023-09-03 DIAGNOSIS — S2241XA Multiple fractures of ribs, right side, initial encounter for closed fracture: Secondary | ICD-10-CM | POA: Diagnosis not present

## 2023-09-03 DIAGNOSIS — S82874A Nondisplaced pilon fracture of right tibia, initial encounter for closed fracture: Secondary | ICD-10-CM | POA: Diagnosis not present

## 2023-09-03 DIAGNOSIS — S22079A Unspecified fracture of T9-T10 vertebra, initial encounter for closed fracture: Secondary | ICD-10-CM | POA: Diagnosis not present

## 2023-09-04 DIAGNOSIS — S82851S Displaced trimalleolar fracture of right lower leg, sequela: Secondary | ICD-10-CM | POA: Diagnosis not present

## 2023-09-04 DIAGNOSIS — S8251XA Displaced fracture of medial malleolus of right tibia, initial encounter for closed fracture: Secondary | ICD-10-CM | POA: Diagnosis not present

## 2023-09-04 DIAGNOSIS — S2231XA Fracture of one rib, right side, initial encounter for closed fracture: Secondary | ICD-10-CM | POA: Diagnosis not present

## 2023-09-04 DIAGNOSIS — R4182 Altered mental status, unspecified: Secondary | ICD-10-CM | POA: Diagnosis not present

## 2023-09-04 DIAGNOSIS — G9340 Encephalopathy, unspecified: Secondary | ICD-10-CM | POA: Diagnosis not present

## 2023-09-04 DIAGNOSIS — R456 Violent behavior: Secondary | ICD-10-CM | POA: Diagnosis not present

## 2023-09-04 DIAGNOSIS — S82871D Displaced pilon fracture of right tibia, subsequent encounter for closed fracture with routine healing: Secondary | ICD-10-CM | POA: Diagnosis not present

## 2023-09-04 DIAGNOSIS — S36899A Unspecified injury of other intra-abdominal organs, initial encounter: Secondary | ICD-10-CM | POA: Diagnosis not present

## 2023-09-04 DIAGNOSIS — M542 Cervicalgia: Secondary | ICD-10-CM | POA: Diagnosis not present

## 2023-09-04 DIAGNOSIS — S82201D Unspecified fracture of shaft of right tibia, subsequent encounter for closed fracture with routine healing: Secondary | ICD-10-CM | POA: Diagnosis not present

## 2023-09-04 DIAGNOSIS — S22069A Unspecified fracture of T7-T8 vertebra, initial encounter for closed fracture: Secondary | ICD-10-CM | POA: Diagnosis not present

## 2023-09-04 DIAGNOSIS — S82401D Unspecified fracture of shaft of right fibula, subsequent encounter for closed fracture with routine healing: Secondary | ICD-10-CM | POA: Diagnosis not present

## 2023-09-04 DIAGNOSIS — S32029A Unspecified fracture of second lumbar vertebra, initial encounter for closed fracture: Secondary | ICD-10-CM | POA: Diagnosis not present

## 2023-09-04 DIAGNOSIS — S22079D Unspecified fracture of T9-T10 vertebra, subsequent encounter for fracture with routine healing: Secondary | ICD-10-CM | POA: Diagnosis not present

## 2023-09-04 DIAGNOSIS — R410 Disorientation, unspecified: Secondary | ICD-10-CM | POA: Diagnosis not present

## 2023-09-04 DIAGNOSIS — S2241XA Multiple fractures of ribs, right side, initial encounter for closed fracture: Secondary | ICD-10-CM | POA: Diagnosis not present

## 2023-09-04 DIAGNOSIS — I10 Essential (primary) hypertension: Secondary | ICD-10-CM | POA: Diagnosis not present

## 2023-09-04 DIAGNOSIS — S82871A Displaced pilon fracture of right tibia, initial encounter for closed fracture: Secondary | ICD-10-CM | POA: Diagnosis not present

## 2023-09-04 DIAGNOSIS — R52 Pain, unspecified: Secondary | ICD-10-CM | POA: Diagnosis not present

## 2023-09-04 DIAGNOSIS — S22079A Unspecified fracture of T9-T10 vertebra, initial encounter for closed fracture: Secondary | ICD-10-CM | POA: Diagnosis not present

## 2023-09-04 DIAGNOSIS — Z743 Need for continuous supervision: Secondary | ICD-10-CM | POA: Diagnosis not present

## 2023-09-04 DIAGNOSIS — J90 Pleural effusion, not elsewhere classified: Secondary | ICD-10-CM | POA: Diagnosis not present

## 2023-09-04 DIAGNOSIS — R918 Other nonspecific abnormal finding of lung field: Secondary | ICD-10-CM | POA: Diagnosis not present

## 2023-09-04 DIAGNOSIS — S22009A Unspecified fracture of unspecified thoracic vertebra, initial encounter for closed fracture: Secondary | ICD-10-CM | POA: Diagnosis not present

## 2023-09-04 DIAGNOSIS — R0902 Hypoxemia: Secondary | ICD-10-CM | POA: Diagnosis not present

## 2023-09-04 DIAGNOSIS — S32019A Unspecified fracture of first lumbar vertebra, initial encounter for closed fracture: Secondary | ICD-10-CM | POA: Diagnosis not present

## 2023-09-04 DIAGNOSIS — S32039A Unspecified fracture of third lumbar vertebra, initial encounter for closed fracture: Secondary | ICD-10-CM | POA: Diagnosis not present

## 2023-09-04 DIAGNOSIS — D649 Anemia, unspecified: Secondary | ICD-10-CM | POA: Diagnosis not present

## 2023-09-05 DIAGNOSIS — R918 Other nonspecific abnormal finding of lung field: Secondary | ICD-10-CM | POA: Diagnosis not present

## 2023-09-05 DIAGNOSIS — S82871D Displaced pilon fracture of right tibia, subsequent encounter for closed fracture with routine healing: Secondary | ICD-10-CM | POA: Diagnosis not present

## 2023-09-05 DIAGNOSIS — S82851S Displaced trimalleolar fracture of right lower leg, sequela: Secondary | ICD-10-CM | POA: Diagnosis not present

## 2023-09-05 DIAGNOSIS — S82401D Unspecified fracture of shaft of right fibula, subsequent encounter for closed fracture with routine healing: Secondary | ICD-10-CM | POA: Diagnosis not present

## 2023-09-05 DIAGNOSIS — R4182 Altered mental status, unspecified: Secondary | ICD-10-CM | POA: Diagnosis not present

## 2023-09-05 DIAGNOSIS — D649 Anemia, unspecified: Secondary | ICD-10-CM | POA: Diagnosis not present

## 2023-09-05 DIAGNOSIS — J90 Pleural effusion, not elsewhere classified: Secondary | ICD-10-CM | POA: Diagnosis not present

## 2023-09-05 DIAGNOSIS — S2231XA Fracture of one rib, right side, initial encounter for closed fracture: Secondary | ICD-10-CM | POA: Diagnosis not present

## 2023-09-05 DIAGNOSIS — G9340 Encephalopathy, unspecified: Secondary | ICD-10-CM | POA: Diagnosis not present

## 2023-09-05 DIAGNOSIS — S22009A Unspecified fracture of unspecified thoracic vertebra, initial encounter for closed fracture: Secondary | ICD-10-CM | POA: Diagnosis not present

## 2023-09-05 DIAGNOSIS — S8251XA Displaced fracture of medial malleolus of right tibia, initial encounter for closed fracture: Secondary | ICD-10-CM | POA: Diagnosis not present

## 2023-09-05 DIAGNOSIS — S82201D Unspecified fracture of shaft of right tibia, subsequent encounter for closed fracture with routine healing: Secondary | ICD-10-CM | POA: Diagnosis not present

## 2023-09-06 DIAGNOSIS — S22009A Unspecified fracture of unspecified thoracic vertebra, initial encounter for closed fracture: Secondary | ICD-10-CM | POA: Diagnosis not present

## 2023-09-06 DIAGNOSIS — D649 Anemia, unspecified: Secondary | ICD-10-CM | POA: Diagnosis not present

## 2023-09-06 DIAGNOSIS — G9340 Encephalopathy, unspecified: Secondary | ICD-10-CM | POA: Diagnosis not present

## 2023-09-06 DIAGNOSIS — I10 Essential (primary) hypertension: Secondary | ICD-10-CM | POA: Diagnosis not present

## 2023-09-06 DIAGNOSIS — D62 Acute posthemorrhagic anemia: Secondary | ICD-10-CM | POA: Diagnosis not present

## 2023-09-06 DIAGNOSIS — R0902 Hypoxemia: Secondary | ICD-10-CM | POA: Diagnosis not present

## 2023-09-06 DIAGNOSIS — S22069S Unspecified fracture of T7-T8 vertebra, sequela: Secondary | ICD-10-CM | POA: Diagnosis not present

## 2023-09-06 DIAGNOSIS — J690 Pneumonitis due to inhalation of food and vomit: Secondary | ICD-10-CM | POA: Diagnosis not present

## 2023-09-06 DIAGNOSIS — S22079S Unspecified fracture of T9-T10 vertebra, sequela: Secondary | ICD-10-CM | POA: Diagnosis not present

## 2023-09-06 DIAGNOSIS — Z91199 Patient's noncompliance with other medical treatment and regimen due to unspecified reason: Secondary | ICD-10-CM | POA: Diagnosis not present

## 2023-09-06 DIAGNOSIS — J9601 Acute respiratory failure with hypoxia: Secondary | ICD-10-CM | POA: Diagnosis not present

## 2023-09-06 DIAGNOSIS — E876 Hypokalemia: Secondary | ICD-10-CM | POA: Diagnosis not present

## 2023-09-06 DIAGNOSIS — I2510 Atherosclerotic heart disease of native coronary artery without angina pectoris: Secondary | ICD-10-CM | POA: Diagnosis not present

## 2023-09-06 DIAGNOSIS — J90 Pleural effusion, not elsewhere classified: Secondary | ICD-10-CM | POA: Diagnosis not present

## 2023-09-06 DIAGNOSIS — R4689 Other symptoms and signs involving appearance and behavior: Secondary | ICD-10-CM | POA: Diagnosis not present

## 2023-09-06 DIAGNOSIS — F05 Delirium due to known physiological condition: Secondary | ICD-10-CM | POA: Diagnosis not present

## 2023-09-06 DIAGNOSIS — S92101S Unspecified fracture of right talus, sequela: Secondary | ICD-10-CM | POA: Diagnosis not present

## 2023-09-06 DIAGNOSIS — Z981 Arthrodesis status: Secondary | ICD-10-CM | POA: Diagnosis not present

## 2023-09-06 DIAGNOSIS — R4182 Altered mental status, unspecified: Secondary | ICD-10-CM | POA: Diagnosis not present

## 2023-09-06 DIAGNOSIS — R918 Other nonspecific abnormal finding of lung field: Secondary | ICD-10-CM | POA: Diagnosis not present

## 2023-09-06 DIAGNOSIS — F0390 Unspecified dementia without behavioral disturbance: Secondary | ICD-10-CM | POA: Diagnosis not present

## 2023-09-06 DIAGNOSIS — S82891A Other fracture of right lower leg, initial encounter for closed fracture: Secondary | ICD-10-CM | POA: Diagnosis not present

## 2023-09-06 DIAGNOSIS — R7401 Elevation of levels of liver transaminase levels: Secondary | ICD-10-CM | POA: Diagnosis not present

## 2023-09-06 DIAGNOSIS — G9341 Metabolic encephalopathy: Secondary | ICD-10-CM | POA: Diagnosis not present

## 2023-09-06 DIAGNOSIS — S82851S Displaced trimalleolar fracture of right lower leg, sequela: Secondary | ICD-10-CM | POA: Diagnosis not present

## 2023-09-06 DIAGNOSIS — I959 Hypotension, unspecified: Secondary | ICD-10-CM | POA: Diagnosis not present

## 2023-09-06 DIAGNOSIS — K219 Gastro-esophageal reflux disease without esophagitis: Secondary | ICD-10-CM | POA: Diagnosis not present

## 2023-09-06 DIAGNOSIS — S8251XA Displaced fracture of medial malleolus of right tibia, initial encounter for closed fracture: Secondary | ICD-10-CM | POA: Diagnosis not present

## 2023-09-06 DIAGNOSIS — S2231XA Fracture of one rib, right side, initial encounter for closed fracture: Secondary | ICD-10-CM | POA: Diagnosis not present

## 2023-09-06 DIAGNOSIS — S82201S Unspecified fracture of shaft of right tibia, sequela: Secondary | ICD-10-CM | POA: Diagnosis not present

## 2023-09-06 DIAGNOSIS — S82401S Unspecified fracture of shaft of right fibula, sequela: Secondary | ICD-10-CM | POA: Diagnosis not present

## 2023-09-06 DIAGNOSIS — S82871S Displaced pilon fracture of right tibia, sequela: Secondary | ICD-10-CM | POA: Diagnosis not present

## 2023-09-06 DIAGNOSIS — Z7401 Bed confinement status: Secondary | ICD-10-CM | POA: Diagnosis not present

## 2023-09-06 DIAGNOSIS — R410 Disorientation, unspecified: Secondary | ICD-10-CM | POA: Diagnosis not present

## 2023-09-06 DIAGNOSIS — E785 Hyperlipidemia, unspecified: Secondary | ICD-10-CM | POA: Diagnosis not present

## 2023-09-06 DIAGNOSIS — F411 Generalized anxiety disorder: Secondary | ICD-10-CM | POA: Diagnosis not present

## 2023-09-07 DIAGNOSIS — D649 Anemia, unspecified: Secondary | ICD-10-CM | POA: Diagnosis not present

## 2023-09-07 DIAGNOSIS — J90 Pleural effusion, not elsewhere classified: Secondary | ICD-10-CM | POA: Diagnosis not present

## 2023-09-07 DIAGNOSIS — S82401S Unspecified fracture of shaft of right fibula, sequela: Secondary | ICD-10-CM | POA: Diagnosis not present

## 2023-09-07 DIAGNOSIS — S82201S Unspecified fracture of shaft of right tibia, sequela: Secondary | ICD-10-CM | POA: Diagnosis not present

## 2023-09-07 DIAGNOSIS — R918 Other nonspecific abnormal finding of lung field: Secondary | ICD-10-CM | POA: Diagnosis not present

## 2023-09-07 DIAGNOSIS — G9340 Encephalopathy, unspecified: Secondary | ICD-10-CM | POA: Diagnosis not present

## 2023-09-07 DIAGNOSIS — R0902 Hypoxemia: Secondary | ICD-10-CM | POA: Diagnosis not present

## 2023-09-07 DIAGNOSIS — J9601 Acute respiratory failure with hypoxia: Secondary | ICD-10-CM | POA: Diagnosis not present

## 2023-09-07 DIAGNOSIS — I10 Essential (primary) hypertension: Secondary | ICD-10-CM | POA: Diagnosis not present

## 2023-09-07 DIAGNOSIS — R410 Disorientation, unspecified: Secondary | ICD-10-CM | POA: Diagnosis not present

## 2023-09-07 DIAGNOSIS — S82871S Displaced pilon fracture of right tibia, sequela: Secondary | ICD-10-CM | POA: Diagnosis not present

## 2023-09-08 DIAGNOSIS — G9340 Encephalopathy, unspecified: Secondary | ICD-10-CM | POA: Diagnosis not present

## 2023-09-09 DIAGNOSIS — G9340 Encephalopathy, unspecified: Secondary | ICD-10-CM | POA: Diagnosis not present

## 2023-09-10 DIAGNOSIS — G9340 Encephalopathy, unspecified: Secondary | ICD-10-CM | POA: Diagnosis not present

## 2023-09-11 DIAGNOSIS — G9340 Encephalopathy, unspecified: Secondary | ICD-10-CM | POA: Diagnosis not present

## 2023-09-12 DIAGNOSIS — G9340 Encephalopathy, unspecified: Secondary | ICD-10-CM | POA: Diagnosis not present

## 2023-09-13 DIAGNOSIS — G9340 Encephalopathy, unspecified: Secondary | ICD-10-CM | POA: Diagnosis not present

## 2023-09-14 DIAGNOSIS — G9340 Encephalopathy, unspecified: Secondary | ICD-10-CM | POA: Diagnosis not present

## 2023-09-15 DIAGNOSIS — G9340 Encephalopathy, unspecified: Secondary | ICD-10-CM | POA: Diagnosis not present

## 2023-09-16 DIAGNOSIS — S82401S Unspecified fracture of shaft of right fibula, sequela: Secondary | ICD-10-CM | POA: Diagnosis not present

## 2023-09-16 DIAGNOSIS — S82871S Displaced pilon fracture of right tibia, sequela: Secondary | ICD-10-CM | POA: Diagnosis not present

## 2023-09-16 DIAGNOSIS — S82201S Unspecified fracture of shaft of right tibia, sequela: Secondary | ICD-10-CM | POA: Diagnosis not present

## 2023-09-16 DIAGNOSIS — E785 Hyperlipidemia, unspecified: Secondary | ICD-10-CM | POA: Diagnosis not present

## 2023-09-16 DIAGNOSIS — G9340 Encephalopathy, unspecified: Secondary | ICD-10-CM | POA: Diagnosis not present

## 2023-09-17 DIAGNOSIS — S2242XA Multiple fractures of ribs, left side, initial encounter for closed fracture: Secondary | ICD-10-CM | POA: Diagnosis not present

## 2023-09-17 DIAGNOSIS — Z7401 Bed confinement status: Secondary | ICD-10-CM | POA: Diagnosis not present

## 2023-09-17 DIAGNOSIS — N179 Acute kidney failure, unspecified: Secondary | ICD-10-CM | POA: Diagnosis not present

## 2023-09-17 DIAGNOSIS — S82201S Unspecified fracture of shaft of right tibia, sequela: Secondary | ICD-10-CM | POA: Diagnosis not present

## 2023-09-17 DIAGNOSIS — S82401S Unspecified fracture of shaft of right fibula, sequela: Secondary | ICD-10-CM | POA: Diagnosis not present

## 2023-09-17 DIAGNOSIS — G9341 Metabolic encephalopathy: Secondary | ICD-10-CM | POA: Diagnosis not present

## 2023-09-17 DIAGNOSIS — D649 Anemia, unspecified: Secondary | ICD-10-CM | POA: Diagnosis not present

## 2023-09-17 DIAGNOSIS — S82871D Displaced pilon fracture of right tibia, subsequent encounter for closed fracture with routine healing: Secondary | ICD-10-CM | POA: Diagnosis not present

## 2023-09-17 DIAGNOSIS — S22079D Unspecified fracture of T9-T10 vertebra, subsequent encounter for fracture with routine healing: Secondary | ICD-10-CM | POA: Diagnosis not present

## 2023-09-17 DIAGNOSIS — I7090 Unspecified atherosclerosis: Secondary | ICD-10-CM | POA: Diagnosis not present

## 2023-09-17 DIAGNOSIS — R7401 Elevation of levels of liver transaminase levels: Secondary | ICD-10-CM | POA: Diagnosis not present

## 2023-09-17 DIAGNOSIS — S82871S Displaced pilon fracture of right tibia, sequela: Secondary | ICD-10-CM | POA: Diagnosis not present

## 2023-09-17 DIAGNOSIS — S22069D Unspecified fracture of T7-T8 vertebra, subsequent encounter for fracture with routine healing: Secondary | ICD-10-CM | POA: Diagnosis not present

## 2023-09-17 DIAGNOSIS — E119 Type 2 diabetes mellitus without complications: Secondary | ICD-10-CM | POA: Diagnosis not present

## 2023-09-17 DIAGNOSIS — J9601 Acute respiratory failure with hypoxia: Secondary | ICD-10-CM | POA: Diagnosis not present

## 2023-09-17 DIAGNOSIS — K219 Gastro-esophageal reflux disease without esophagitis: Secondary | ICD-10-CM | POA: Diagnosis not present

## 2023-09-17 DIAGNOSIS — S8251XD Displaced fracture of medial malleolus of right tibia, subsequent encounter for closed fracture with routine healing: Secondary | ICD-10-CM | POA: Diagnosis not present

## 2023-09-17 DIAGNOSIS — G9340 Encephalopathy, unspecified: Secondary | ICD-10-CM | POA: Diagnosis not present

## 2023-09-17 DIAGNOSIS — R451 Restlessness and agitation: Secondary | ICD-10-CM | POA: Diagnosis not present

## 2023-09-17 DIAGNOSIS — S82891A Other fracture of right lower leg, initial encounter for closed fracture: Secondary | ICD-10-CM | POA: Diagnosis not present

## 2023-09-17 DIAGNOSIS — I959 Hypotension, unspecified: Secondary | ICD-10-CM | POA: Diagnosis not present

## 2023-09-17 DIAGNOSIS — I10 Essential (primary) hypertension: Secondary | ICD-10-CM | POA: Diagnosis not present

## 2023-09-17 DIAGNOSIS — E785 Hyperlipidemia, unspecified: Secondary | ICD-10-CM | POA: Diagnosis not present

## 2023-09-17 DIAGNOSIS — S2241XD Multiple fractures of ribs, right side, subsequent encounter for fracture with routine healing: Secondary | ICD-10-CM | POA: Diagnosis not present

## 2023-09-17 DIAGNOSIS — D62 Acute posthemorrhagic anemia: Secondary | ICD-10-CM | POA: Diagnosis not present

## 2023-09-17 DIAGNOSIS — S8261XD Displaced fracture of lateral malleolus of right fibula, subsequent encounter for closed fracture with routine healing: Secondary | ICD-10-CM | POA: Diagnosis not present

## 2023-09-18 DIAGNOSIS — S82891A Other fracture of right lower leg, initial encounter for closed fracture: Secondary | ICD-10-CM | POA: Diagnosis not present

## 2023-09-18 DIAGNOSIS — D62 Acute posthemorrhagic anemia: Secondary | ICD-10-CM | POA: Diagnosis not present

## 2023-09-18 DIAGNOSIS — I10 Essential (primary) hypertension: Secondary | ICD-10-CM | POA: Diagnosis not present

## 2023-09-18 DIAGNOSIS — S2242XA Multiple fractures of ribs, left side, initial encounter for closed fracture: Secondary | ICD-10-CM | POA: Diagnosis not present

## 2023-09-19 DIAGNOSIS — S2242XA Multiple fractures of ribs, left side, initial encounter for closed fracture: Secondary | ICD-10-CM | POA: Diagnosis not present

## 2023-09-19 DIAGNOSIS — S82891A Other fracture of right lower leg, initial encounter for closed fracture: Secondary | ICD-10-CM | POA: Diagnosis not present

## 2023-09-19 DIAGNOSIS — I10 Essential (primary) hypertension: Secondary | ICD-10-CM | POA: Diagnosis not present

## 2023-09-19 DIAGNOSIS — D62 Acute posthemorrhagic anemia: Secondary | ICD-10-CM | POA: Diagnosis not present

## 2023-09-20 DIAGNOSIS — I10 Essential (primary) hypertension: Secondary | ICD-10-CM | POA: Diagnosis not present

## 2023-09-20 DIAGNOSIS — D62 Acute posthemorrhagic anemia: Secondary | ICD-10-CM | POA: Diagnosis not present

## 2023-09-20 DIAGNOSIS — S82891A Other fracture of right lower leg, initial encounter for closed fracture: Secondary | ICD-10-CM | POA: Diagnosis not present

## 2023-09-20 DIAGNOSIS — S2242XA Multiple fractures of ribs, left side, initial encounter for closed fracture: Secondary | ICD-10-CM | POA: Diagnosis not present

## 2023-09-21 DIAGNOSIS — S82871D Displaced pilon fracture of right tibia, subsequent encounter for closed fracture with routine healing: Secondary | ICD-10-CM | POA: Diagnosis not present

## 2023-09-21 DIAGNOSIS — I10 Essential (primary) hypertension: Secondary | ICD-10-CM | POA: Diagnosis not present

## 2023-09-21 DIAGNOSIS — S82891A Other fracture of right lower leg, initial encounter for closed fracture: Secondary | ICD-10-CM | POA: Diagnosis not present

## 2023-09-21 DIAGNOSIS — D62 Acute posthemorrhagic anemia: Secondary | ICD-10-CM | POA: Diagnosis not present

## 2023-09-21 DIAGNOSIS — S2242XA Multiple fractures of ribs, left side, initial encounter for closed fracture: Secondary | ICD-10-CM | POA: Diagnosis not present

## 2023-09-22 DIAGNOSIS — S2242XA Multiple fractures of ribs, left side, initial encounter for closed fracture: Secondary | ICD-10-CM | POA: Diagnosis not present

## 2023-09-22 DIAGNOSIS — J9601 Acute respiratory failure with hypoxia: Secondary | ICD-10-CM | POA: Diagnosis not present

## 2023-09-22 DIAGNOSIS — D649 Anemia, unspecified: Secondary | ICD-10-CM | POA: Diagnosis not present

## 2023-09-22 DIAGNOSIS — S82891A Other fracture of right lower leg, initial encounter for closed fracture: Secondary | ICD-10-CM | POA: Diagnosis not present

## 2023-09-22 DIAGNOSIS — E785 Hyperlipidemia, unspecified: Secondary | ICD-10-CM | POA: Diagnosis not present

## 2023-09-22 DIAGNOSIS — D62 Acute posthemorrhagic anemia: Secondary | ICD-10-CM | POA: Diagnosis not present

## 2023-09-22 DIAGNOSIS — N179 Acute kidney failure, unspecified: Secondary | ICD-10-CM | POA: Diagnosis not present

## 2023-09-22 DIAGNOSIS — I10 Essential (primary) hypertension: Secondary | ICD-10-CM | POA: Diagnosis not present

## 2023-09-23 DIAGNOSIS — D62 Acute posthemorrhagic anemia: Secondary | ICD-10-CM | POA: Diagnosis not present

## 2023-09-23 DIAGNOSIS — S2242XA Multiple fractures of ribs, left side, initial encounter for closed fracture: Secondary | ICD-10-CM | POA: Diagnosis not present

## 2023-09-23 DIAGNOSIS — S82891A Other fracture of right lower leg, initial encounter for closed fracture: Secondary | ICD-10-CM | POA: Diagnosis not present

## 2023-09-23 DIAGNOSIS — I10 Essential (primary) hypertension: Secondary | ICD-10-CM | POA: Diagnosis not present

## 2023-09-24 DIAGNOSIS — D62 Acute posthemorrhagic anemia: Secondary | ICD-10-CM | POA: Diagnosis not present

## 2023-09-24 DIAGNOSIS — S2242XA Multiple fractures of ribs, left side, initial encounter for closed fracture: Secondary | ICD-10-CM | POA: Diagnosis not present

## 2023-09-24 DIAGNOSIS — S82891A Other fracture of right lower leg, initial encounter for closed fracture: Secondary | ICD-10-CM | POA: Diagnosis not present

## 2023-09-24 DIAGNOSIS — I10 Essential (primary) hypertension: Secondary | ICD-10-CM | POA: Diagnosis not present

## 2023-09-25 DIAGNOSIS — S82891A Other fracture of right lower leg, initial encounter for closed fracture: Secondary | ICD-10-CM | POA: Diagnosis not present

## 2023-09-25 DIAGNOSIS — S2242XA Multiple fractures of ribs, left side, initial encounter for closed fracture: Secondary | ICD-10-CM | POA: Diagnosis not present

## 2023-09-25 DIAGNOSIS — I10 Essential (primary) hypertension: Secondary | ICD-10-CM | POA: Diagnosis not present

## 2023-09-25 DIAGNOSIS — D62 Acute posthemorrhagic anemia: Secondary | ICD-10-CM | POA: Diagnosis not present

## 2023-09-26 DIAGNOSIS — S2242XA Multiple fractures of ribs, left side, initial encounter for closed fracture: Secondary | ICD-10-CM | POA: Diagnosis not present

## 2023-09-26 DIAGNOSIS — S82891A Other fracture of right lower leg, initial encounter for closed fracture: Secondary | ICD-10-CM | POA: Diagnosis not present

## 2023-09-26 DIAGNOSIS — I10 Essential (primary) hypertension: Secondary | ICD-10-CM | POA: Diagnosis not present

## 2023-09-26 DIAGNOSIS — D62 Acute posthemorrhagic anemia: Secondary | ICD-10-CM | POA: Diagnosis not present

## 2023-09-27 DIAGNOSIS — S82891A Other fracture of right lower leg, initial encounter for closed fracture: Secondary | ICD-10-CM | POA: Diagnosis not present

## 2023-09-27 DIAGNOSIS — D62 Acute posthemorrhagic anemia: Secondary | ICD-10-CM | POA: Diagnosis not present

## 2023-09-27 DIAGNOSIS — I10 Essential (primary) hypertension: Secondary | ICD-10-CM | POA: Diagnosis not present

## 2023-09-27 DIAGNOSIS — S2242XA Multiple fractures of ribs, left side, initial encounter for closed fracture: Secondary | ICD-10-CM | POA: Diagnosis not present

## 2023-09-28 DIAGNOSIS — D62 Acute posthemorrhagic anemia: Secondary | ICD-10-CM | POA: Diagnosis not present

## 2023-09-28 DIAGNOSIS — S82891A Other fracture of right lower leg, initial encounter for closed fracture: Secondary | ICD-10-CM | POA: Diagnosis not present

## 2023-09-28 DIAGNOSIS — K219 Gastro-esophageal reflux disease without esophagitis: Secondary | ICD-10-CM | POA: Diagnosis not present

## 2023-09-28 DIAGNOSIS — S2242XA Multiple fractures of ribs, left side, initial encounter for closed fracture: Secondary | ICD-10-CM | POA: Diagnosis not present

## 2023-09-28 DIAGNOSIS — I10 Essential (primary) hypertension: Secondary | ICD-10-CM | POA: Diagnosis not present

## 2023-09-28 DIAGNOSIS — D649 Anemia, unspecified: Secondary | ICD-10-CM | POA: Diagnosis not present

## 2023-09-28 DIAGNOSIS — E785 Hyperlipidemia, unspecified: Secondary | ICD-10-CM | POA: Diagnosis not present

## 2023-09-29 DIAGNOSIS — S82891A Other fracture of right lower leg, initial encounter for closed fracture: Secondary | ICD-10-CM | POA: Diagnosis not present

## 2023-09-29 DIAGNOSIS — D62 Acute posthemorrhagic anemia: Secondary | ICD-10-CM | POA: Diagnosis not present

## 2023-09-29 DIAGNOSIS — I10 Essential (primary) hypertension: Secondary | ICD-10-CM | POA: Diagnosis not present

## 2023-09-29 DIAGNOSIS — S2242XA Multiple fractures of ribs, left side, initial encounter for closed fracture: Secondary | ICD-10-CM | POA: Diagnosis not present

## 2023-09-30 DIAGNOSIS — I10 Essential (primary) hypertension: Secondary | ICD-10-CM | POA: Diagnosis not present

## 2023-09-30 DIAGNOSIS — S2242XA Multiple fractures of ribs, left side, initial encounter for closed fracture: Secondary | ICD-10-CM | POA: Diagnosis not present

## 2023-09-30 DIAGNOSIS — D62 Acute posthemorrhagic anemia: Secondary | ICD-10-CM | POA: Diagnosis not present

## 2023-09-30 DIAGNOSIS — S82891A Other fracture of right lower leg, initial encounter for closed fracture: Secondary | ICD-10-CM | POA: Diagnosis not present

## 2023-10-01 DIAGNOSIS — S2242XA Multiple fractures of ribs, left side, initial encounter for closed fracture: Secondary | ICD-10-CM | POA: Diagnosis not present

## 2023-10-01 DIAGNOSIS — S82891A Other fracture of right lower leg, initial encounter for closed fracture: Secondary | ICD-10-CM | POA: Diagnosis not present

## 2023-10-01 DIAGNOSIS — I10 Essential (primary) hypertension: Secondary | ICD-10-CM | POA: Diagnosis not present

## 2023-10-01 DIAGNOSIS — D62 Acute posthemorrhagic anemia: Secondary | ICD-10-CM | POA: Diagnosis not present

## 2023-10-02 DIAGNOSIS — I10 Essential (primary) hypertension: Secondary | ICD-10-CM | POA: Diagnosis not present

## 2023-10-02 DIAGNOSIS — S2242XA Multiple fractures of ribs, left side, initial encounter for closed fracture: Secondary | ICD-10-CM | POA: Diagnosis not present

## 2023-10-02 DIAGNOSIS — D62 Acute posthemorrhagic anemia: Secondary | ICD-10-CM | POA: Diagnosis not present

## 2023-10-02 DIAGNOSIS — S82891A Other fracture of right lower leg, initial encounter for closed fracture: Secondary | ICD-10-CM | POA: Diagnosis not present

## 2023-10-09 DIAGNOSIS — S82871D Displaced pilon fracture of right tibia, subsequent encounter for closed fracture with routine healing: Secondary | ICD-10-CM | POA: Diagnosis not present

## 2023-10-11 DIAGNOSIS — M4324 Fusion of spine, thoracic region: Secondary | ICD-10-CM | POA: Diagnosis not present

## 2023-10-11 DIAGNOSIS — S22078D Other fracture of T9-T10 vertebra, subsequent encounter for fracture with routine healing: Secondary | ICD-10-CM | POA: Diagnosis not present

## 2023-11-15 DIAGNOSIS — Z981 Arthrodesis status: Secondary | ICD-10-CM | POA: Diagnosis not present

## 2023-11-22 DIAGNOSIS — I25810 Atherosclerosis of coronary artery bypass graft(s) without angina pectoris: Secondary | ICD-10-CM | POA: Diagnosis not present

## 2023-11-22 DIAGNOSIS — S22069S Unspecified fracture of T7-T8 vertebra, sequela: Secondary | ICD-10-CM | POA: Diagnosis not present

## 2023-11-22 DIAGNOSIS — I209 Angina pectoris, unspecified: Secondary | ICD-10-CM | POA: Diagnosis not present

## 2023-11-22 DIAGNOSIS — N182 Chronic kidney disease, stage 2 (mild): Secondary | ICD-10-CM | POA: Diagnosis not present

## 2023-11-22 DIAGNOSIS — E1151 Type 2 diabetes mellitus with diabetic peripheral angiopathy without gangrene: Secondary | ICD-10-CM | POA: Diagnosis not present

## 2023-11-22 DIAGNOSIS — I129 Hypertensive chronic kidney disease with stage 1 through stage 4 chronic kidney disease, or unspecified chronic kidney disease: Secondary | ICD-10-CM | POA: Diagnosis not present

## 2023-11-22 DIAGNOSIS — E7800 Pure hypercholesterolemia, unspecified: Secondary | ICD-10-CM | POA: Diagnosis not present

## 2023-11-22 DIAGNOSIS — D692 Other nonthrombocytopenic purpura: Secondary | ICD-10-CM | POA: Diagnosis not present

## 2023-11-22 DIAGNOSIS — F331 Major depressive disorder, recurrent, moderate: Secondary | ICD-10-CM | POA: Diagnosis not present

## 2023-11-22 DIAGNOSIS — E6609 Other obesity due to excess calories: Secondary | ICD-10-CM | POA: Diagnosis not present

## 2023-11-22 DIAGNOSIS — N401 Enlarged prostate with lower urinary tract symptoms: Secondary | ICD-10-CM | POA: Diagnosis not present

## 2023-11-22 DIAGNOSIS — M4324 Fusion of spine, thoracic region: Secondary | ICD-10-CM | POA: Diagnosis not present

## 2023-12-11 DIAGNOSIS — S82871D Displaced pilon fracture of right tibia, subsequent encounter for closed fracture with routine healing: Secondary | ICD-10-CM | POA: Diagnosis not present

## 2024-02-21 DIAGNOSIS — M4324 Fusion of spine, thoracic region: Secondary | ICD-10-CM | POA: Diagnosis not present

## 2024-02-21 DIAGNOSIS — M4814 Ankylosing hyperostosis [Forestier], thoracic region: Secondary | ICD-10-CM | POA: Diagnosis not present

## 2024-03-19 DIAGNOSIS — E6609 Other obesity due to excess calories: Secondary | ICD-10-CM | POA: Diagnosis not present

## 2024-03-19 DIAGNOSIS — I25119 Atherosclerotic heart disease of native coronary artery with unspecified angina pectoris: Secondary | ICD-10-CM | POA: Diagnosis not present

## 2024-03-19 DIAGNOSIS — E7800 Pure hypercholesterolemia, unspecified: Secondary | ICD-10-CM | POA: Diagnosis not present

## 2024-03-19 DIAGNOSIS — I129 Hypertensive chronic kidney disease with stage 1 through stage 4 chronic kidney disease, or unspecified chronic kidney disease: Secondary | ICD-10-CM | POA: Diagnosis not present

## 2024-03-19 DIAGNOSIS — D692 Other nonthrombocytopenic purpura: Secondary | ICD-10-CM | POA: Diagnosis not present

## 2024-03-19 DIAGNOSIS — N182 Chronic kidney disease, stage 2 (mild): Secondary | ICD-10-CM | POA: Diagnosis not present

## 2024-03-19 DIAGNOSIS — E1151 Type 2 diabetes mellitus with diabetic peripheral angiopathy without gangrene: Secondary | ICD-10-CM | POA: Diagnosis not present

## 2024-03-19 DIAGNOSIS — N401 Enlarged prostate with lower urinary tract symptoms: Secondary | ICD-10-CM | POA: Diagnosis not present

## 2024-03-19 DIAGNOSIS — R82998 Other abnormal findings in urine: Secondary | ICD-10-CM | POA: Diagnosis not present

## 2024-03-19 DIAGNOSIS — I25810 Atherosclerosis of coronary artery bypass graft(s) without angina pectoris: Secondary | ICD-10-CM | POA: Diagnosis not present

## 2024-03-19 DIAGNOSIS — I209 Angina pectoris, unspecified: Secondary | ICD-10-CM | POA: Diagnosis not present

## 2024-03-19 DIAGNOSIS — F331 Major depressive disorder, recurrent, moderate: Secondary | ICD-10-CM | POA: Diagnosis not present

## 2024-03-19 DIAGNOSIS — S22069S Unspecified fracture of T7-T8 vertebra, sequela: Secondary | ICD-10-CM | POA: Diagnosis not present

## 2024-05-12 DIAGNOSIS — H52223 Regular astigmatism, bilateral: Secondary | ICD-10-CM | POA: Diagnosis not present

## 2024-05-12 DIAGNOSIS — H5211 Myopia, right eye: Secondary | ICD-10-CM | POA: Diagnosis not present

## 2024-05-12 DIAGNOSIS — H2513 Age-related nuclear cataract, bilateral: Secondary | ICD-10-CM | POA: Diagnosis not present

## 2024-05-12 DIAGNOSIS — H35033 Hypertensive retinopathy, bilateral: Secondary | ICD-10-CM | POA: Diagnosis not present

## 2024-05-12 DIAGNOSIS — E119 Type 2 diabetes mellitus without complications: Secondary | ICD-10-CM | POA: Diagnosis not present

## 2024-05-12 DIAGNOSIS — H524 Presbyopia: Secondary | ICD-10-CM | POA: Diagnosis not present

## 2024-05-12 DIAGNOSIS — H353134 Nonexudative age-related macular degeneration, bilateral, advanced atrophic with subfoveal involvement: Secondary | ICD-10-CM | POA: Diagnosis not present

## 2024-05-22 DIAGNOSIS — M4324 Fusion of spine, thoracic region: Secondary | ICD-10-CM | POA: Diagnosis not present

## 2024-06-02 DIAGNOSIS — H2513 Age-related nuclear cataract, bilateral: Secondary | ICD-10-CM | POA: Diagnosis not present

## 2024-06-02 DIAGNOSIS — H40013 Open angle with borderline findings, low risk, bilateral: Secondary | ICD-10-CM | POA: Diagnosis not present

## 2024-06-02 DIAGNOSIS — E119 Type 2 diabetes mellitus without complications: Secondary | ICD-10-CM | POA: Diagnosis not present

## 2024-06-02 DIAGNOSIS — H2511 Age-related nuclear cataract, right eye: Secondary | ICD-10-CM | POA: Diagnosis not present

## 2024-06-02 DIAGNOSIS — H25013 Cortical age-related cataract, bilateral: Secondary | ICD-10-CM | POA: Diagnosis not present

## 2024-06-10 DIAGNOSIS — H353133 Nonexudative age-related macular degeneration, bilateral, advanced atrophic without subfoveal involvement: Secondary | ICD-10-CM | POA: Diagnosis not present

## 2024-06-10 DIAGNOSIS — H2513 Age-related nuclear cataract, bilateral: Secondary | ICD-10-CM | POA: Diagnosis not present

## 2024-06-10 DIAGNOSIS — H35433 Paving stone degeneration of retina, bilateral: Secondary | ICD-10-CM | POA: Diagnosis not present

## 2024-06-10 DIAGNOSIS — H43813 Vitreous degeneration, bilateral: Secondary | ICD-10-CM | POA: Diagnosis not present

## 2024-06-10 DIAGNOSIS — H35033 Hypertensive retinopathy, bilateral: Secondary | ICD-10-CM | POA: Diagnosis not present

## 2024-06-17 DIAGNOSIS — N401 Enlarged prostate with lower urinary tract symptoms: Secondary | ICD-10-CM | POA: Diagnosis not present

## 2024-06-17 DIAGNOSIS — F331 Major depressive disorder, recurrent, moderate: Secondary | ICD-10-CM | POA: Diagnosis not present

## 2024-06-17 DIAGNOSIS — I209 Angina pectoris, unspecified: Secondary | ICD-10-CM | POA: Diagnosis not present

## 2024-06-17 DIAGNOSIS — I129 Hypertensive chronic kidney disease with stage 1 through stage 4 chronic kidney disease, or unspecified chronic kidney disease: Secondary | ICD-10-CM | POA: Diagnosis not present

## 2024-06-17 DIAGNOSIS — N182 Chronic kidney disease, stage 2 (mild): Secondary | ICD-10-CM | POA: Diagnosis not present

## 2024-06-17 DIAGNOSIS — E1151 Type 2 diabetes mellitus with diabetic peripheral angiopathy without gangrene: Secondary | ICD-10-CM | POA: Diagnosis not present

## 2024-06-17 DIAGNOSIS — I25810 Atherosclerosis of coronary artery bypass graft(s) without angina pectoris: Secondary | ICD-10-CM | POA: Diagnosis not present

## 2024-06-17 DIAGNOSIS — D692 Other nonthrombocytopenic purpura: Secondary | ICD-10-CM | POA: Diagnosis not present

## 2024-06-17 DIAGNOSIS — S22069S Unspecified fracture of T7-T8 vertebra, sequela: Secondary | ICD-10-CM | POA: Diagnosis not present

## 2024-06-17 DIAGNOSIS — K760 Fatty (change of) liver, not elsewhere classified: Secondary | ICD-10-CM | POA: Diagnosis not present

## 2024-06-17 DIAGNOSIS — E6609 Other obesity due to excess calories: Secondary | ICD-10-CM | POA: Diagnosis not present

## 2024-06-17 DIAGNOSIS — H3530 Unspecified macular degeneration: Secondary | ICD-10-CM | POA: Diagnosis not present

## 2024-07-24 DIAGNOSIS — H353133 Nonexudative age-related macular degeneration, bilateral, advanced atrophic without subfoveal involvement: Secondary | ICD-10-CM | POA: Diagnosis not present

## 2024-08-12 DIAGNOSIS — S92151D Displaced avulsion fracture (chip fracture) of right talus, subsequent encounter for fracture with routine healing: Secondary | ICD-10-CM | POA: Diagnosis not present

## 2024-08-12 DIAGNOSIS — S82871D Displaced pilon fracture of right tibia, subsequent encounter for closed fracture with routine healing: Secondary | ICD-10-CM | POA: Diagnosis not present

## 2024-08-20 DIAGNOSIS — Z125 Encounter for screening for malignant neoplasm of prostate: Secondary | ICD-10-CM | POA: Diagnosis not present

## 2024-08-20 DIAGNOSIS — I129 Hypertensive chronic kidney disease with stage 1 through stage 4 chronic kidney disease, or unspecified chronic kidney disease: Secondary | ICD-10-CM | POA: Diagnosis not present

## 2024-08-20 DIAGNOSIS — E7800 Pure hypercholesterolemia, unspecified: Secondary | ICD-10-CM | POA: Diagnosis not present

## 2024-08-20 DIAGNOSIS — E1151 Type 2 diabetes mellitus with diabetic peripheral angiopathy without gangrene: Secondary | ICD-10-CM | POA: Diagnosis not present

## 2024-08-20 DIAGNOSIS — E7849 Other hyperlipidemia: Secondary | ICD-10-CM | POA: Diagnosis not present

## 2024-08-25 DIAGNOSIS — H353131 Nonexudative age-related macular degeneration, bilateral, early dry stage: Secondary | ICD-10-CM | POA: Diagnosis not present

## 2024-08-25 DIAGNOSIS — H40013 Open angle with borderline findings, low risk, bilateral: Secondary | ICD-10-CM | POA: Diagnosis not present

## 2024-08-25 DIAGNOSIS — H2513 Age-related nuclear cataract, bilateral: Secondary | ICD-10-CM | POA: Diagnosis not present

## 2024-08-25 DIAGNOSIS — E119 Type 2 diabetes mellitus without complications: Secondary | ICD-10-CM | POA: Diagnosis not present

## 2024-08-26 DIAGNOSIS — H2513 Age-related nuclear cataract, bilateral: Secondary | ICD-10-CM | POA: Diagnosis not present

## 2024-08-26 DIAGNOSIS — H353133 Nonexudative age-related macular degeneration, bilateral, advanced atrophic without subfoveal involvement: Secondary | ICD-10-CM | POA: Diagnosis not present

## 2024-08-26 DIAGNOSIS — H43813 Vitreous degeneration, bilateral: Secondary | ICD-10-CM | POA: Diagnosis not present

## 2024-08-26 DIAGNOSIS — H35433 Paving stone degeneration of retina, bilateral: Secondary | ICD-10-CM | POA: Diagnosis not present

## 2024-08-26 DIAGNOSIS — H35033 Hypertensive retinopathy, bilateral: Secondary | ICD-10-CM | POA: Diagnosis not present

## 2024-08-27 DIAGNOSIS — I209 Angina pectoris, unspecified: Secondary | ICD-10-CM | POA: Diagnosis not present

## 2024-08-27 DIAGNOSIS — H9193 Unspecified hearing loss, bilateral: Secondary | ICD-10-CM | POA: Diagnosis not present

## 2024-08-27 DIAGNOSIS — N401 Enlarged prostate with lower urinary tract symptoms: Secondary | ICD-10-CM | POA: Diagnosis not present

## 2024-08-27 DIAGNOSIS — E7800 Pure hypercholesterolemia, unspecified: Secondary | ICD-10-CM | POA: Diagnosis not present

## 2024-08-27 DIAGNOSIS — Z1339 Encounter for screening examination for other mental health and behavioral disorders: Secondary | ICD-10-CM | POA: Diagnosis not present

## 2024-08-27 DIAGNOSIS — E1151 Type 2 diabetes mellitus with diabetic peripheral angiopathy without gangrene: Secondary | ICD-10-CM | POA: Diagnosis not present

## 2024-08-27 DIAGNOSIS — N182 Chronic kidney disease, stage 2 (mild): Secondary | ICD-10-CM | POA: Diagnosis not present

## 2024-08-27 DIAGNOSIS — I25810 Atherosclerosis of coronary artery bypass graft(s) without angina pectoris: Secondary | ICD-10-CM | POA: Diagnosis not present

## 2024-08-27 DIAGNOSIS — Z0000 Encounter for general adult medical examination without abnormal findings: Secondary | ICD-10-CM | POA: Diagnosis not present

## 2024-08-27 DIAGNOSIS — Z23 Encounter for immunization: Secondary | ICD-10-CM | POA: Diagnosis not present

## 2024-08-27 DIAGNOSIS — E6609 Other obesity due to excess calories: Secondary | ICD-10-CM | POA: Diagnosis not present

## 2024-08-27 DIAGNOSIS — F331 Major depressive disorder, recurrent, moderate: Secondary | ICD-10-CM | POA: Diagnosis not present

## 2024-08-27 DIAGNOSIS — I679 Cerebrovascular disease, unspecified: Secondary | ICD-10-CM | POA: Diagnosis not present

## 2024-08-27 DIAGNOSIS — I129 Hypertensive chronic kidney disease with stage 1 through stage 4 chronic kidney disease, or unspecified chronic kidney disease: Secondary | ICD-10-CM | POA: Diagnosis not present

## 2024-08-27 DIAGNOSIS — R809 Proteinuria, unspecified: Secondary | ICD-10-CM | POA: Diagnosis not present

## 2024-08-27 DIAGNOSIS — R82998 Other abnormal findings in urine: Secondary | ICD-10-CM | POA: Diagnosis not present

## 2024-09-04 DIAGNOSIS — H2511 Age-related nuclear cataract, right eye: Secondary | ICD-10-CM | POA: Diagnosis not present

## 2024-09-05 DIAGNOSIS — H25012 Cortical age-related cataract, left eye: Secondary | ICD-10-CM | POA: Diagnosis not present

## 2024-09-05 DIAGNOSIS — H2512 Age-related nuclear cataract, left eye: Secondary | ICD-10-CM | POA: Diagnosis not present

## 2024-09-05 DIAGNOSIS — H25042 Posterior subcapsular polar age-related cataract, left eye: Secondary | ICD-10-CM | POA: Diagnosis not present

## 2024-09-11 DIAGNOSIS — H2513 Age-related nuclear cataract, bilateral: Secondary | ICD-10-CM | POA: Diagnosis not present

## 2024-09-17 DIAGNOSIS — H2513 Age-related nuclear cataract, bilateral: Secondary | ICD-10-CM | POA: Diagnosis not present

## 2024-09-17 DIAGNOSIS — H353133 Nonexudative age-related macular degeneration, bilateral, advanced atrophic without subfoveal involvement: Secondary | ICD-10-CM | POA: Diagnosis not present

## 2024-09-17 DIAGNOSIS — H35433 Paving stone degeneration of retina, bilateral: Secondary | ICD-10-CM | POA: Diagnosis not present

## 2024-09-17 DIAGNOSIS — H35033 Hypertensive retinopathy, bilateral: Secondary | ICD-10-CM | POA: Diagnosis not present

## 2024-09-17 DIAGNOSIS — H43813 Vitreous degeneration, bilateral: Secondary | ICD-10-CM | POA: Diagnosis not present

## 2024-09-25 DIAGNOSIS — H2512 Age-related nuclear cataract, left eye: Secondary | ICD-10-CM | POA: Diagnosis not present

## 2024-09-25 DIAGNOSIS — H5371 Glare sensitivity: Secondary | ICD-10-CM | POA: Diagnosis not present

## 2024-10-02 DIAGNOSIS — H2513 Age-related nuclear cataract, bilateral: Secondary | ICD-10-CM | POA: Diagnosis not present
